# Patient Record
Sex: Female | Born: 1970 | Race: Black or African American | Hispanic: No | Marital: Single | Smoking: Never smoker
Health system: Southern US, Community
[De-identification: ages and names within clinical notes are randomized; demographics above are authoritative.]

## PROBLEM LIST (undated history)

## (undated) DIAGNOSIS — I10 Essential (primary) hypertension: Secondary | ICD-10-CM

## (undated) DIAGNOSIS — R011 Cardiac murmur, unspecified: Secondary | ICD-10-CM

## (undated) DIAGNOSIS — E7800 Pure hypercholesterolemia, unspecified: Secondary | ICD-10-CM

## (undated) HISTORY — PX: ABDOMINAL HYSTERECTOMY: SHX81

## (undated) HISTORY — DX: Essential (primary) hypertension: I10

## (undated) HISTORY — DX: Cardiac murmur, unspecified: R01.1

## (undated) HISTORY — PX: CHOLECYSTECTOMY: SHX55

---

## 2015-11-03 ENCOUNTER — Encounter: Payer: Self-pay | Admitting: Family Medicine

## 2015-11-03 ENCOUNTER — Ambulatory Visit (INDEPENDENT_AMBULATORY_CARE_PROVIDER_SITE_OTHER): Payer: BLUE CROSS/BLUE SHIELD | Admitting: Family Medicine

## 2015-11-03 VITALS — BP 157/107 | HR 86 | Resp 20 | Ht 65.5 in | Wt 217.0 lb

## 2015-11-03 DIAGNOSIS — Z90710 Acquired absence of both cervix and uterus: Secondary | ICD-10-CM

## 2015-11-03 DIAGNOSIS — Z01419 Encounter for gynecological examination (general) (routine) without abnormal findings: Secondary | ICD-10-CM

## 2015-11-03 DIAGNOSIS — I10 Essential (primary) hypertension: Secondary | ICD-10-CM

## 2015-11-03 NOTE — Assessment & Plan Note (Signed)
Needs Primary Care referral--at [facility].

## 2015-11-03 NOTE — Patient Instructions (Signed)
Hypertension Hypertension, commonly called high blood pressure, is when the force of blood pumping through your arteries is too strong. Your arteries are the blood vessels that carry blood from your heart throughout your body. A blood pressure reading consists of a higher number over a lower number, such as 110/72. The higher number (systolic) is the pressure inside your arteries when your heart pumps. The lower number (diastolic) is the pressure inside your arteries when your heart relaxes. Ideally you want your blood pressure below 120/80. Hypertension forces your heart to work harder to pump blood. Your arteries may become narrow or stiff. Having untreated or uncontrolled hypertension can cause heart attack, stroke, kidney disease, and other problems. RISK FACTORS Some risk factors for high blood pressure are controllable. Others are not.  Risk factors you cannot control include:   Race. You may be at higher risk if you are African American.  Age. Risk increases with age.  Gender. Men are at higher risk than women before age 50 years. After age 25, women are at higher risk than men. Risk factors you can control include:  Not getting enough exercise or physical activity.  Being overweight.  Getting too much fat, sugar, calories, or salt in your diet.  Drinking too much alcohol. SIGNS AND SYMPTOMS Hypertension does not usually cause signs or symptoms. Extremely high blood pressure (hypertensive crisis) may cause headache, anxiety, shortness of breath, and nosebleed. DIAGNOSIS To check if you have hypertension, your health care provider will measure your blood pressure while you are seated, with your arm held at the level of your heart. It should be measured at least twice using the same arm. Certain conditions can cause a difference in blood pressure between your right and left arms. A blood pressure reading that is higher than normal on one occasion does not mean that you need treatment. If  it is not clear whether you have high blood pressure, you may be asked to return on a different day to have your blood pressure checked again. Or, you may be asked to monitor your blood pressure at home for 1 or more weeks. TREATMENT Treating high blood pressure includes making lifestyle changes and possibly taking medicine. Living a healthy lifestyle can help lower high blood pressure. You may need to change some of your habits. Lifestyle changes may include:  Following the DASH diet. This diet is high in fruits, vegetables, and whole grains. It is low in salt, red meat, and added sugars.  Keep your sodium intake below 2,300 mg per day.  Getting at least 30-45 minutes of aerobic exercise at least 4 times per week.  Losing weight if necessary.  Not smoking.  Limiting alcoholic beverages.  Learning ways to reduce stress. Your health care provider may prescribe medicine if lifestyle changes are not enough to get your blood pressure under control, and if one of the following is true:  You are 62-65 years of age and your systolic blood pressure is above 140.  You are 6 years of age or older, and your systolic blood pressure is above 150.  Your diastolic blood pressure is above 90.  You have diabetes, and your systolic blood pressure is over 160 or your diastolic blood pressure is over 90.  You have kidney disease and your blood pressure is above 140/90.  You have heart disease and your blood pressure is above 140/90. Your personal target blood pressure may vary depending on your medical conditions, your age, and other factors. HOME CARE INSTRUCTIONS  Have your blood pressure rechecked as directed by your health care provider.   Take medicines only as directed by your health care provider. Follow the directions carefully. Blood pressure medicines must be taken as prescribed. The medicine does not work as well when you skip doses. Skipping doses also puts you at risk for  problems.  Do not smoke.   Monitor your blood pressure at home as directed by your health care provider. SEEK MEDICAL CARE IF:   You think you are having a reaction to medicines taken.  You have recurrent headaches or feel dizzy.  You have swelling in your ankles.  You have trouble with your vision. SEEK IMMEDIATE MEDICAL CARE IF:  You develop a severe headache or confusion.  You have unusual weakness, numbness, or feel faint.  You have severe chest or abdominal pain.  You vomit repeatedly.  You have trouble breathing. MAKE SURE YOU:   Understand these instructions.  Will watch your condition.  Will get help right away if you are not doing well or get worse.   This information is not intended to replace advice given to you by your health care provider. Make sure you discuss any questions you have with your health care provider.   Document Released: 10/18/2005 Document Revised: 03/04/2015 Document Reviewed: 08/10/2013 Elsevier Interactive Patient Education 2016 [place] DASH stands for "Dietary Approaches to Stop Hypertension." The DASH eating plan is a healthy eating plan that has been shown to reduce high blood pressure (hypertension). Additional health benefits may include reducing the risk of type 2 diabetes mellitus, heart disease, and stroke. The DASH eating plan may also help with weight loss. WHAT DO I NEED TO KNOW ABOUT THE DASH EATING PLAN? For the DASH eating plan, you will follow these general guidelines:  Choose foods with a percent daily value for sodium of less than 5% (as listed on the food label).  Use salt-free seasonings or herbs instead of table salt or sea salt.  Check with your health care provider or pharmacist before using salt substitutes.  Eat lower-sodium products, often labeled as "lower sodium" or "no salt added."  Eat fresh foods.  Eat more vegetables, fruits, and low-fat dairy products.  Choose whole grains.  Look for the word "whole" as the first word in the ingredient list.  Choose fish and skinless chicken or [place] more often than red meat. Limit fish, poultry, and meat to 6 oz (170 g) each day.  Limit sweets, desserts, sugars, and sugary drinks.  Choose heart-healthy fats.  Limit cheese to 1 oz (28 g) per day.  Eat more home-cooked food and less restaurant, buffet, and fast food.  Limit fried foods.  Cook foods using methods other than frying.  Limit canned vegetables. If you do use them, rinse them well to decrease the sodium.  When eating at a restaurant, ask that your food be prepared with less salt, or no salt if possible. WHAT FOODS CAN I EAT? Seek help from a dietitian for individual calorie needs. Grains Whole grain or whole wheat bread. Brown rice. Whole grain or whole wheat pasta. Quinoa, bulgur, and whole grain cereals. Low-sodium cereals. Corn or whole wheat flour tortillas. Whole grain cornbread. Whole grain crackers. Low-sodium crackers. Vegetables Fresh or frozen vegetables (raw, steamed, roasted, or grilled). Low-sodium or reduced-sodium tomato and vegetable juices. Low-sodium or reduced-sodium tomato sauce and paste. Low-sodium or reduced-sodium canned vegetables.  Fruits All fresh, canned (in natural juice), or frozen fruits. Meat and Other  Protein Products Ground beef (85% or leaner), grass-fed beef, or beef trimmed of fat. Skinless chicken or [place]. Ground chicken or [place]. Pork trimmed of fat. All fish and seafood. Eggs. Dried beans, peas, or lentils. Unsalted nuts and seeds. Unsalted canned beans. Dairy Low-fat dairy products, such as skim or 1% milk, 2% or reduced-fat cheeses, low-fat ricotta or cottage cheese, or plain low-fat yogurt. Low-sodium or reduced-sodium cheeses. Fats and Oils Tub margarines without trans fats. Light or reduced-fat mayonnaise and salad dressings (reduced sodium). Avocado. Safflower, olive, or canola oils. Natural peanut or almond  butter. Other Unsalted popcorn and pretzels. The items listed above may not be a complete list of recommended foods or beverages. Contact your dietitian for more options. WHAT FOODS ARE NOT RECOMMENDED? Grains White bread. White pasta. White rice. Refined cornbread. Bagels and croissants. Crackers that contain trans fat. Vegetables Creamed or fried vegetables. Vegetables in a cheese sauce. Regular canned vegetables. Regular canned tomato sauce and paste. Regular tomato and vegetable juices. Fruits Dried fruits. Canned fruit in light or heavy syrup. Fruit juice. Meat and Other Protein Products Fatty cuts of meat. Ribs, chicken wings, bacon, sausage, bologna, salami, chitterlings, fatback, hot dogs, bratwurst, and packaged luncheon meats. Salted nuts and seeds. Canned beans with salt. Dairy Whole or 2% milk, cream, half-and-half, and cream cheese. Whole-fat or sweetened yogurt. Full-fat cheeses or blue cheese. Nondairy creamers and whipped toppings. Processed cheese, cheese spreads, or cheese curds. Condiments Onion and garlic salt, seasoned salt, table salt, and sea salt. Canned and packaged gravies. Worcestershire sauce. Tartar sauce. Barbecue sauce. Teriyaki sauce. Soy sauce, including reduced sodium. Steak sauce. Fish sauce. Oyster sauce. Cocktail sauce. Horseradish. Ketchup and mustard. Meat flavorings and tenderizers. Bouillon cubes. Hot sauce. Tabasco sauce. Marinades. Taco seasonings. Relishes. Fats and Oils Butter, stick margarine, lard, shortening, ghee, and bacon fat. Coconut, palm kernel, or palm oils. Regular salad dressings. Other Pickles and olives. Salted popcorn and pretzels. The items listed above may not be a complete list of foods and beverages to avoid. Contact your dietitian for more information. WHERE CAN I FIND MORE INFORMATION? National Heart, Lung, and Blood Institute: travelstabloid.com   This information is not intended to replace  advice given to you by your health care provider. Make sure you discuss any questions you have with your health care provider.   Document Released: 10/07/2011 Document Revised: 11/08/2014 Document Reviewed: 08/22/2013 Elsevier Interactive Patient Education 2016 [place] for Adults, Female A healthy lifestyle and preventive care can promote health and wellness. Preventive health guidelines for women include the following key practices.  A routine yearly physical is a good way to check with your health care provider about your health and preventive screening. It is a chance to share any concerns and updates on your health and to receive a thorough exam.  Visit your dentist for a routine exam and preventive care every 6 months. Brush your teeth twice a day and floss once a day. Good oral hygiene prevents tooth decay and gum disease.  The frequency of eye exams is based on your age, health, family medical history, use of contact lenses, and other factors. Follow your health care provider's recommendations for frequency of eye exams.  Eat a healthy diet. Foods like vegetables, fruits, whole grains, low-fat dairy products, and lean protein foods contain the nutrients you need without too many calories. Decrease your intake of foods high in solid fats, added sugars, and salt. Eat the right amount of calories for you.Get information about  a proper diet from your health care provider, if necessary.  Regular physical exercise is one of the most important things you can do for your health. Most adults should get at least 150 minutes of moderate-intensity exercise (any activity that increases your heart rate and causes you to sweat) each week. In addition, most adults need muscle-strengthening exercises on 2 or more days a week.  Maintain a healthy weight. The body mass index (BMI) is a screening tool to identify possible weight problems. It provides an estimate of body fat based on  height and weight. Your health care provider can find your BMI and can help you achieve or maintain a healthy weight.For adults 20 years and older:  A BMI below 18.5 is considered underweight.  A BMI of 18.5 to 24.9 is normal.  A BMI of 25 to 29.9 is considered overweight.  A BMI of 30 and above is considered obese.  Maintain normal blood lipids and cholesterol levels by exercising and minimizing your intake of saturated fat. Eat a balanced diet with plenty of fruit and vegetables. Blood tests for lipids and cholesterol should begin at age 62 and be repeated every 5 years. If your lipid or cholesterol levels are high, you are over 50, or you are at high risk for heart disease, you may need your cholesterol levels checked more frequently.Ongoing high lipid and cholesterol levels should be treated with medicines if diet and exercise are not working.  If you smoke, find out from your health care provider how to quit. If you do not use tobacco, do not start.  Lung cancer screening is recommended for adults aged 12-80 years who are at high risk for developing lung cancer because of a history of smoking. A yearly low-dose CT scan of the lungs is recommended for people who have at least a 30-pack-year history of smoking and are a current smoker or have quit within the past 15 years. A pack year of smoking is smoking an average of 1 pack of cigarettes a day for 1 year (for example: 1 pack a day for 30 years or 2 packs a day for 15 years). Yearly screening should continue until the smoker has stopped smoking for at least 15 years. Yearly screening should be stopped for people who develop a health problem that would prevent them from having lung cancer treatment.  If you are pregnant, do not drink alcohol. If you are breastfeeding, be very cautious about drinking alcohol. If you are not pregnant and choose to drink alcohol, do not have more than 1 drink per day. One drink is considered to be 12 ounces (355  mL) of beer, 5 ounces (148 mL) of wine, or 1.5 ounces (44 mL) of liquor.  Avoid use of street drugs. Do not share needles with anyone. Ask for help if you need support or instructions about stopping the use of drugs.  High blood pressure causes heart disease and increases the risk of stroke. Your blood pressure should be checked at least every 1 to 2 years. Ongoing high blood pressure should be treated with medicines if weight loss and exercise do not work.  If you are 59-45 years old, ask your health care provider if you should take aspirin to prevent strokes.  Diabetes screening is done by taking a blood sample to check your blood glucose level after you have not eaten for a certain period of time (fasting). If you are not overweight and you do not have risk factors for diabetes,  you should be screened once every 3 years starting at age 41. If you are overweight or obese and you are 47-51 years of age, you should be screened for diabetes every year as part of your cardiovascular risk assessment.  Breast cancer screening is essential preventive care for women. You should practice "breast self-awareness." This means understanding the normal appearance and feel of your breasts and may include breast self-examination. Any changes detected, no matter how small, should be reported to a health care provider. Women in their 78s and 30s should have a clinical breast exam (CBE) by a health care provider as part of a regular health exam every 1 to 3 years. After age 42, women should have a CBE every year. Starting at age 38, women should consider having a mammogram (breast X-ray test) every year. Women who have a family history of breast cancer should talk to their health care provider about genetic screening. Women at a high risk of breast cancer should talk to their health care providers about having an MRI and a mammogram every year.  Breast cancer gene (BRCA)-related cancer risk assessment is recommended for  women who have family members with BRCA-related cancers. BRCA-related cancers include breast, ovarian, tubal, and peritoneal cancers. Having family members with these cancers may be associated with an increased risk for harmful changes (mutations) in the breast cancer genes BRCA1 and BRCA2. Results of the assessment will determine the need for genetic counseling and BRCA1 and BRCA2 testing.  Your health care provider may recommend that you be screened regularly for cancer of the pelvic organs (ovaries, uterus, and vagina). This screening involves a pelvic examination, including checking for microscopic changes to the surface of your cervix (Pap test). You may be encouraged to have this screening done every 3 years, beginning at age 30.  For women ages 70-65, health care providers may recommend pelvic exams and Pap testing every 3 years, or they may recommend the Pap and pelvic exam, combined with testing for human papilloma virus (HPV), every 5 years. Some types of HPV increase your risk of cervical cancer. Testing for HPV may also be done on women of any age with unclear Pap test results.  Other health care providers may not recommend any screening for nonpregnant women who are considered low risk for pelvic cancer and who do not have symptoms. Ask your health care provider if a screening pelvic exam is right for you.  If you have had past treatment for cervical cancer or a condition that could lead to cancer, you need Pap tests and screening for cancer for at least 20 years after your treatment. If Pap tests have been discontinued, your risk factors (such as having a new sexual partner) need to be reassessed to determine if screening should resume. Some women have medical problems that increase the chance of getting cervical cancer. In these cases, your health care provider may recommend more frequent screening and Pap tests.  Colorectal cancer can be detected and often prevented. Most routine colorectal  cancer screening begins at the age of 91 years and continues through age 74 years. However, your health care provider may recommend screening at an earlier age if you have risk factors for colon cancer. On a yearly basis, your health care provider may provide home test kits to check for hidden blood in the stool. Use of a small camera at the end of a tube, to directly examine the colon (sigmoidoscopy or colonoscopy), can detect the earliest forms of colorectal  cancer. Talk to your health care provider about this at age 35, when routine screening begins. Direct exam of the colon should be repeated every 5-10 years through age 63 years, unless early forms of precancerous polyps or small growths are found.  People who are at an increased risk for hepatitis B should be screened for this virus. You are considered at high risk for hepatitis B if:  You were born in a country where hepatitis B occurs often. Talk with your health care provider about which countries are considered high risk.  Your parents were born in a high-risk country and you have not received a shot to protect against hepatitis B (hepatitis B vaccine).  You have HIV or AIDS.  You use needles to inject street drugs.  You live with, or have sex with, someone who has hepatitis B.  You get hemodialysis treatment.  You take certain medicines for conditions like cancer, organ transplantation, and autoimmune conditions.  Hepatitis C blood testing is recommended for all people born from [birth year] through 1966 and any individual with known risks for hepatitis C.  Practice safe sex. Use condoms and avoid high-risk sexual practices to reduce the spread of sexually transmitted infections (STIs). STIs include gonorrhea, chlamydia, syphilis, trichomonas, herpes, HPV, and human immunodeficiency virus (HIV). Herpes, HIV, and HPV are viral illnesses that have no cure. They can result in disability, cancer, and death.  You should be screened for sexually  transmitted illnesses (STIs) including gonorrhea and chlamydia if:  You are sexually active and are younger than 24 years.  You are older than 24 years and your health care provider tells you that you are at risk for this type of infection.  Your sexual activity has changed since you were last screened and you are at an increased risk for chlamydia or gonorrhea. Ask your health care provider if you are at risk.  If you are at risk of being infected with HIV, it is recommended that you take a prescription medicine daily to prevent HIV infection. This is called preexposure prophylaxis (PrEP). You are considered at risk if:  You are sexually active and do not regularly use condoms or know the HIV status of your partner(s).  You take drugs by injection.  You are sexually active with a partner who has HIV.  Talk with your health care provider about whether you are at high risk of being infected with HIV. If you choose to begin PrEP, you should first be tested for HIV. You should then be tested every 3 months for as long as you are taking PrEP.  Osteoporosis is a disease in which the bones lose minerals and strength with aging. This can result in serious bone fractures or breaks. The risk of osteoporosis can be identified using a bone density scan. Women ages 35 years and over and women at risk for fractures or osteoporosis should discuss screening with their health care providers. Ask your health care provider whether you should take a calcium supplement or vitamin D to reduce the rate of osteoporosis.  Menopause can be associated with physical symptoms and risks. Hormone replacement therapy is available to decrease symptoms and risks. You should talk to your health care provider about whether hormone replacement therapy is right for you.  Use sunscreen. Apply sunscreen liberally and repeatedly throughout the day. You should seek shade when your shadow is shorter than you. Protect yourself by  wearing long sleeves, pants, a wide-brimmed hat, and sunglasses year round, whenever you are  outdoors.  Once a month, do a whole body skin exam, using a mirror to look at the skin on your back. Tell your health care provider of new moles, moles that have irregular borders, moles that are larger than a pencil eraser, or moles that have changed in shape or color.  Stay current with required vaccines (immunizations).  Influenza vaccine. All adults should be immunized every year.  Tetanus, diphtheria, and acellular pertussis (Td, Tdap) vaccine. Pregnant women should receive 1 dose of Tdap vaccine during each pregnancy. The dose should be obtained regardless of the length of time since the last dose. Immunization is preferred during the 27th-36th week of gestation. An adult who has not previously received Tdap or who does not know her vaccine status should receive 1 dose of Tdap. This initial dose should be followed by tetanus and diphtheria toxoids (Td) booster doses every 10 years. Adults with an unknown or incomplete history of completing a 3-dose immunization series with Td-containing vaccines should begin or complete a primary immunization series including a Tdap dose. Adults should receive a Td booster every 10 years.  Varicella vaccine. An adult without evidence of immunity to varicella should receive 2 doses or a second dose if she has previously received 1 dose. Pregnant females who do not have evidence of immunity should receive the first dose after pregnancy. This first dose should be obtained before leaving the health care facility. The second dose should be obtained 4-8 weeks after the first dose.  Human papillomavirus (HPV) vaccine. Females aged 13-26 years who have not received the vaccine previously should obtain the 3-dose series. The vaccine is not recommended for use in pregnant females. However, pregnancy testing is not needed before receiving a dose. If a female is found to be pregnant  after receiving a dose, no treatment is needed. In that case, the remaining doses should be delayed until after the pregnancy. Immunization is recommended for any person with an immunocompromised condition through the age of 16 years if she did not get any or all doses earlier. During the 3-dose series, the second dose should be obtained 4-8 weeks after the first dose. The third dose should be obtained 24 weeks after the first dose and 16 weeks after the second dose.  Zoster vaccine. One dose is recommended for adults aged 80 years or older unless certain conditions are present.  Measles, mumps, and rubella (MMR) vaccine. Adults born before [birth year] generally are considered immune to measles and mumps. Adults born in [birth year] or later should have 1 or more doses of MMR vaccine unless there is a contraindication to the vaccine or there is laboratory evidence of immunity to each of the three diseases. A routine second dose of MMR vaccine should be obtained at least 28 days after the first dose for students attending postsecondary schools, health care workers, or international travelers. People who received inactivated measles vaccine or an unknown type of measles vaccine during 1963-1967 should receive 2 doses of MMR vaccine. People who received inactivated mumps vaccine or an unknown type of mumps vaccine before 1979 and are at high risk for mumps infection should consider immunization with 2 doses of MMR vaccine. For females of childbearing age, rubella immunity should be determined. If there is no evidence of immunity, females who are not pregnant should be vaccinated. If there is no evidence of immunity, females who are pregnant should delay immunization until after pregnancy. Unvaccinated health care workers born before 1957 who lack laboratory evidence of measles,  mumps, or rubella immunity or laboratory confirmation of disease should consider measles and mumps immunization with 2 doses of MMR vaccine or rubella  immunization with 1 dose of MMR vaccine.  Pneumococcal 13-valent conjugate (PCV13) vaccine. When indicated, a person who is uncertain of his immunization history and has no record of immunization should receive the PCV13 vaccine. All adults 57 years of age and older should receive this vaccine. An adult aged 61 years or older who has certain medical conditions and has not been previously immunized should receive 1 dose of PCV13 vaccine. This PCV13 should be followed with a dose of pneumococcal polysaccharide (PPSV23) vaccine. Adults who are at high risk for pneumococcal disease should obtain the PPSV23 vaccine at least 8 weeks after the dose of PCV13 vaccine. Adults older than 46 years of age who have normal immune system function should obtain the PPSV23 vaccine dose at least 1 year after the dose of PCV13 vaccine.  Pneumococcal polysaccharide (PPSV23) vaccine. When PCV13 is also indicated, PCV13 should be obtained first. All adults aged 39 years and older should be immunized. An adult younger than age 27 years who has certain medical conditions should be immunized. Any person who resides in a nursing home or long-term care facility should be immunized. An adult smoker should be immunized. People with an immunocompromised condition and certain other conditions should receive both PCV13 and PPSV23 vaccines. People with human immunodeficiency virus (HIV) infection should be immunized as soon as possible after diagnosis. Immunization during chemotherapy or radiation therapy should be avoided. Routine use of PPSV23 vaccine is not recommended for American Indians, [place] Natives, or people younger than 65 years unless there are medical conditions that require PPSV23 vaccine. When indicated, people who have unknown immunization and have no record of immunization should receive PPSV23 vaccine. One-time revaccination 5 years after the first dose of PPSV23 is recommended for people aged 19-64 years who have chronic  kidney failure, nephrotic syndrome, asplenia, or immunocompromised conditions. People who received 1-2 doses of PPSV23 before age 27 years should receive another dose of PPSV23 vaccine at age 80 years or later if at least 5 years have passed since the previous dose. Doses of PPSV23 are not needed for people immunized with PPSV23 at or after age 18 years.  Meningococcal vaccine. Adults with asplenia or persistent complement component deficiencies should receive 2 doses of quadrivalent meningococcal conjugate (MenACWY-D) vaccine. The doses should be obtained at least 2 months apart. Microbiologists working with certain meningococcal bacteria, [place] recruits, people at risk during an outbreak, and people who travel to or live in countries with a high rate of meningitis should be immunized. A first-year college student up through age 54 years who is living in a residence hall should receive a dose if she did not receive a dose on or after her 16th birthday. Adults who have certain high-risk conditions should receive one or more doses of vaccine.  Hepatitis A vaccine. Adults who wish to be protected from this disease, have certain high-risk conditions, work with hepatitis A-infected animals, work in hepatitis A research labs, or travel to or work in countries with a high rate of hepatitis A should be immunized. Adults who were previously unvaccinated and who anticipate close contact with an international adoptee during the first 60 days after arrival in the [place] from a country with a high rate of hepatitis A should be immunized.  Hepatitis B vaccine. Adults who wish to be protected from this disease, have certain high-risk conditions,  may be exposed to blood or other infectious body fluids, are household contacts or sex partners of hepatitis B positive people, are clients or workers in certain care facilities, or travel to or work in countries with a high rate of hepatitis B should be  immunized.  Haemophilus influenzae type b (Hib) vaccine. A previously unvaccinated person with asplenia or sickle cell disease or having a scheduled splenectomy should receive 1 dose of Hib vaccine. Regardless of previous immunization, a recipient of a hematopoietic stem cell transplant should receive a 3-dose series 6-12 months after her successful transplant. Hib vaccine is not recommended for adults with HIV infection. Preventive Services / Frequency Ages 88 to 25 years  Blood pressure check.** / Every 3-5 years.  Lipid and cholesterol check.** / Every 5 years beginning at age 82.  Clinical breast exam.** / Every 3 years for women in their 54s and 81s.  BRCA-related cancer risk assessment.** / For women who have family members with a BRCA-related cancer (breast, ovarian, tubal, or peritoneal cancers).  Pap test.** / Every 2 years from ages 59 through 38. Every 3 years starting at age 11 through age 21 or 69 with a history of 3 consecutive normal Pap tests.  HPV screening.** / Every 3 years from ages 11 through ages 55 to 10 with a history of 3 consecutive normal Pap tests.  Hepatitis C blood test.** / For any individual with known risks for hepatitis C.  Skin self-exam. / Monthly.  Influenza vaccine. / Every year.  Tetanus, diphtheria, and acellular pertussis (Tdap, Td) vaccine.** / Consult your health care provider. Pregnant women should receive 1 dose of Tdap vaccine during each pregnancy. 1 dose of Td every 10 years.  Varicella vaccine.** / Consult your health care provider. Pregnant females who do not have evidence of immunity should receive the first dose after pregnancy.  HPV vaccine. / 3 doses over 6 months, if 33 and younger. The vaccine is not recommended for use in pregnant females. However, pregnancy testing is not needed before receiving a dose.  Measles, mumps, rubella (MMR) vaccine.** / You need at least 1 dose of MMR if you were born in 1957 or later. You may also need  a 2nd dose. For females of childbearing age, rubella immunity should be determined. If there is no evidence of immunity, females who are not pregnant should be vaccinated. If there is no evidence of immunity, females who are pregnant should delay immunization until after pregnancy.  Pneumococcal 13-valent conjugate (PCV13) vaccine.** / Consult your health care provider.  Pneumococcal polysaccharide (PPSV23) vaccine.** / 1 to 2 doses if you smoke cigarettes or if you have certain conditions.  Meningococcal vaccine.** / 1 dose if you are age 8 to 57 years and a Market researcher living in a residence hall, or have one of several medical conditions, you need to get vaccinated against meningococcal disease. You may also need additional booster doses.  Hepatitis A vaccine.** / Consult your health care provider.  Hepatitis B vaccine.** / Consult your health care provider.  Haemophilus influenzae type b (Hib) vaccine.** / Consult your health care provider. Ages 86 to 78 years  Blood pressure check.** / Every year.  Lipid and cholesterol check.** / Every 5 years beginning at age 40 years.  Lung cancer screening. / Every year if you are aged 6-80 years and have a 30-pack-year history of smoking and currently smoke or have quit within the past 15 years. Yearly screening is stopped once you have quit  smoking for at least 15 years or develop a health problem that would prevent you from having lung cancer treatment.  Clinical breast exam.** / Every year after age 78 years.  BRCA-related cancer risk assessment.** / For women who have family members with a BRCA-related cancer (breast, ovarian, tubal, or peritoneal cancers).  Mammogram.** / Every year beginning at age 79 years and continuing for as long as you are in good health. Consult with your health care provider.  Pap test.** / Every 3 years starting at age 73 years through age 52 or 53 years with a history of 3 consecutive normal Pap  tests.  HPV screening.** / Every 3 years from ages 43 years through ages 31 to 40 years with a history of 3 consecutive normal Pap tests.  Fecal occult blood test (FOBT) of stool. / Every year beginning at age 62 years and continuing until age 57 years. You may not need to do this test if you get a colonoscopy every 10 years.  Flexible sigmoidoscopy or colonoscopy.** / Every 5 years for a flexible sigmoidoscopy or every 10 years for a colonoscopy beginning at age 72 years and continuing until age 57 years.  Hepatitis C blood test.** / For all people born from [birth year] through 1965 and any individual with known risks for hepatitis C.  Skin self-exam. / Monthly.  Influenza vaccine. / Every year.  Tetanus, diphtheria, and acellular pertussis (Tdap/Td) vaccine.** / Consult your health care provider. Pregnant women should receive 1 dose of Tdap vaccine during each pregnancy. 1 dose of Td every 10 years.  Varicella vaccine.** / Consult your health care provider. Pregnant females who do not have evidence of immunity should receive the first dose after pregnancy.  Zoster vaccine.** / 1 dose for adults aged 38 years or older.  Measles, mumps, rubella (MMR) vaccine.** / You need at least 1 dose of MMR if you were born in 1957 or later. You may also need a second dose. For females of childbearing age, rubella immunity should be determined. If there is no evidence of immunity, females who are not pregnant should be vaccinated. If there is no evidence of immunity, females who are pregnant should delay immunization until after pregnancy.  Pneumococcal 13-valent conjugate (PCV13) vaccine.** / Consult your health care provider.  Pneumococcal polysaccharide (PPSV23) vaccine.** / 1 to 2 doses if you smoke cigarettes or if you have certain conditions.  Meningococcal vaccine.** / Consult your health care provider.  Hepatitis A vaccine.** / Consult your health care provider.  Hepatitis B vaccine.** / Consult  your health care provider.  Haemophilus influenzae type b (Hib) vaccine.** / Consult your health care provider. Ages 37 years and over  Blood pressure check.** / Every year.  Lipid and cholesterol check.** / Every 5 years beginning at age 36 years.  Lung cancer screening. / Every year if you are aged 2-80 years and have a 30-pack-year history of smoking and currently smoke or have quit within the past 15 years. Yearly screening is stopped once you have quit smoking for at least 15 years or develop a health problem that would prevent you from having lung cancer treatment.  Clinical breast exam.** / Every year after age 38 years.  BRCA-related cancer risk assessment.** / For women who have family members with a BRCA-related cancer (breast, ovarian, tubal, or peritoneal cancers).  Mammogram.** / Every year beginning at age 35 years and continuing for as long as you are in good health. Consult with your health care provider.  Pap test.** / Every 3 years starting at age 18 years through age 32 or 62 years with 3 consecutive normal Pap tests. Testing can be stopped between 65 and 70 years with 3 consecutive normal Pap tests and no abnormal Pap or HPV tests in the past 10 years.  HPV screening.** / Every 3 years from ages 38 years through ages 30 or 25 years with a history of 3 consecutive normal Pap tests. Testing can be stopped between 65 and 70 years with 3 consecutive normal Pap tests and no abnormal Pap or HPV tests in the past 10 years.  Fecal occult blood test (FOBT) of stool. / Every year beginning at age 75 years and continuing until age 85 years. You may not need to do this test if you get a colonoscopy every 10 years.  Flexible sigmoidoscopy or colonoscopy.** / Every 5 years for a flexible sigmoidoscopy or every 10 years for a colonoscopy beginning at age 97 years and continuing until age 58 years.  Hepatitis C blood test.** / For all people born from [birth year] through 1965 and any  individual with known risks for hepatitis C.  Osteoporosis screening.** / A one-time screening for women ages 15 years and over and women at risk for fractures or osteoporosis.  Skin self-exam. / Monthly.  Influenza vaccine. / Every year.  Tetanus, diphtheria, and acellular pertussis (Tdap/Td) vaccine.** / 1 dose of Td every 10 years.  Varicella vaccine.** / Consult your health care provider.  Zoster vaccine.** / 1 dose for adults aged 43 years or older.  Pneumococcal 13-valent conjugate (PCV13) vaccine.** / Consult your health care provider.  Pneumococcal polysaccharide (PPSV23) vaccine.** / 1 dose for all adults aged 22 years and older.  Meningococcal vaccine.** / Consult your health care provider.  Hepatitis A vaccine.** / Consult your health care provider.  Hepatitis B vaccine.** / Consult your health care provider.  Haemophilus influenzae type b (Hib) vaccine.** / Consult your health care provider. ** Family history and personal history of risk and conditions may change your health care provider's recommendations.   This information is not intended to replace advice given to you by your health care provider. Make sure you discuss any questions you have with your health care provider.   Document Released: 12/14/2001 Document Revised: 11/08/2014 Document Reviewed: 03/15/2011 Elsevier Interactive Patient Education [employer].

## 2015-11-03 NOTE — Progress Notes (Signed)
  Subjective:     Barbara Vasquez is a 45 y.o. female and is here for a comprehensive physical exam. The patient reports problems - headaches.  Social History   Social History  . Marital Status: Single    Spouse Name: N/A  . Number of Children: N/A  . Years of Education: N/A   Occupational History  . Not on file.   Social History Main Topics  . Smoking status: Never Smoker   . Smokeless tobacco: Not on file  . Alcohol Use: Yes     Comment: occasional  . Drug Use: No  . Sexual Activity: Not Currently    Birth Control/ Protection: Surgical   Other Topics Concern  . Not on file   Social History Narrative  . No narrative on file   Health Maintenance  Topic Date Due  . HIV Screening  02/23/1985  . TETANUS/TDAP  02/23/1989  . PAP SMEAR  02/24/1991  . INFLUENZA VACCINE  11/02/2016 (Originally 06/02/2015)    The following portions of the patient's history were reviewed and updated as appropriate: allergies, current medications, past family history, past medical history, past social history, past surgical history and problem list.  Review of Systems Pertinent items noted in HPI and remainder of comprehensive ROS otherwise negative.   Objective:    BP 157/107 mmHg  Pulse 86  Resp 20  Ht 5' 5.5" (1.664 m)  Wt 217 lb (98.431 kg)  BMI 35.55 kg/m2 General appearance: alert, cooperative and appears stated age Head: Normocephalic, without obvious abnormality, atraumatic Neck: no adenopathy, supple, symmetrical, trachea midline, thyroid not enlarged, symmetric, no tenderness/mass/nodules and acanthosis nigricans Lungs: clear to auscultation bilaterally Breasts: normal appearance, no masses or tenderness Heart: regular rate and rhythm, S1, S2 normal, no murmur, click, rub or gallop Abdomen: soft, non-tender; bowel sounds normal; no masses,  no organomegaly Pelvic: external genitalia normal, no adnexal masses or tenderness, uterus surgically absent and vagina normal without  discharge Extremities: extremities normal, atraumatic, no cyanosis or edema Pulses: 2+ and symmetric Skin: Skin color, texture, turgor normal. No rashes or lesions Lymph nodes: Cervical, supraclavicular, and axillary nodes normal. Neurologic: Grossly normal    Assessment:    Healthy female exam.      Plan:   Problem List Items Addressed This Visit      Unprioritized   Hypertension    [facility]       Other Visit Diagnoses    Encounter for routine gynecological examination    -  Primary    Relevant Orders    CBC    Comprehensive metabolic panel    TSH    Lipid panel    MM DIGITAL SCREENING BILATERAL       See After Visit Summary for Counseling Recommendations

## 2015-11-04 LAB — CBC
HCT: 40.7 % (ref 36.0–46.0)
HEMOGLOBIN: 13.6 g/dL (ref 12.0–15.0)
MCH: 30.8 pg (ref 26.0–34.0)
MCHC: 33.4 g/dL (ref 30.0–36.0)
MCV: 92.3 fL (ref 78.0–100.0)
MPV: 9.1 fL (ref 8.6–12.4)
PLATELETS: 353 10*3/uL (ref 150–400)
RBC: 4.41 MIL/uL (ref 3.87–5.11)
RDW: 13.4 % (ref 11.5–15.5)
WBC: 10.1 10*3/uL (ref 4.0–10.5)

## 2015-11-05 ENCOUNTER — Telehealth: Payer: Self-pay | Admitting: *Deleted

## 2015-11-05 LAB — COMPREHENSIVE METABOLIC PANEL
ALT: 17 U/L (ref 6–29)
AST: 16 U/L (ref 10–35)
Albumin: 4.2 g/dL (ref 3.6–5.1)
Alkaline Phosphatase: 88 U/L (ref 33–115)
BILIRUBIN TOTAL: 0.2 mg/dL (ref 0.2–1.2)
BUN: 12 mg/dL (ref 7–25)
CHLORIDE: 102 mmol/L (ref 98–110)
CO2: 26 mmol/L (ref 20–31)
CREATININE: 0.85 mg/dL (ref 0.50–1.10)
Calcium: 9.3 mg/dL (ref 8.6–10.2)
GLUCOSE: 96 mg/dL (ref 65–99)
Potassium: 3.9 mmol/L (ref 3.5–5.3)
SODIUM: 136 mmol/L (ref 135–146)
Total Protein: 7.3 g/dL (ref 6.1–8.1)

## 2015-11-05 LAB — LIPID PANEL
CHOL/HDL RATIO: 6.3 ratio — AB (ref <=5.0)
Cholesterol: 272 mg/dL — ABNORMAL HIGH (ref 125–200)
HDL: 43 mg/dL — ABNORMAL LOW (ref >=46)
LDL CALC: 200 mg/dL — AB (ref <130)
Triglycerides: 147 mg/dL (ref <150)
VLDL: 29 mg/dL (ref <30)

## 2015-11-05 LAB — TSH: TSH: 1.249 u[IU]/mL (ref 0.350–4.500)

## 2015-11-05 NOTE — Telephone Encounter (Signed)
Informed pt of results and high cholesterol level.  Pt has appointment made at [facility] on 11-11-14 to get established with PCP to address BP and high cholesterol.

## 2015-11-05 NOTE — Telephone Encounter (Signed)
-----   Message from Reva Boresanya S Pratt, MD sent at 11/05/2015  7:48 AM EST ----- Her cholesterol remains too high--she may broach with PCP--ensure referral due to BP--other labs look good.

## 2015-11-12 ENCOUNTER — Encounter: Payer: Self-pay | Admitting: Primary Care

## 2015-11-12 ENCOUNTER — Ambulatory Visit (INDEPENDENT_AMBULATORY_CARE_PROVIDER_SITE_OTHER): Payer: BLUE CROSS/BLUE SHIELD | Admitting: Primary Care

## 2015-11-12 ENCOUNTER — Encounter (INDEPENDENT_AMBULATORY_CARE_PROVIDER_SITE_OTHER): Payer: Self-pay

## 2015-11-12 VITALS — BP 162/110 | HR 93 | Temp 98.9°F | Ht 66.0 in | Wt 217.0 lb

## 2015-11-12 DIAGNOSIS — E785 Hyperlipidemia, unspecified: Secondary | ICD-10-CM

## 2015-11-12 DIAGNOSIS — I10 Essential (primary) hypertension: Secondary | ICD-10-CM

## 2015-11-12 DIAGNOSIS — Z114 Encounter for screening for human immunodeficiency virus [HIV]: Secondary | ICD-10-CM | POA: Diagnosis not present

## 2015-11-12 DIAGNOSIS — E78019 Familial hypercholesterolemia, unspecified: Secondary | ICD-10-CM | POA: Insufficient documentation

## 2015-11-12 MED ORDER — ATORVASTATIN CALCIUM 40 MG PO TABS: 40.0000 mg | ORAL_TABLET | Freq: Every day | ORAL | Status: DC

## 2015-11-12 MED ORDER — HYDROCHLOROTHIAZIDE 25 MG PO TABS: 25.0000 mg | ORAL_TABLET | Freq: Every day | ORAL | Status: DC

## 2015-11-12 NOTE — Progress Notes (Signed)
Pre visit review using our clinic review tool, if applicable. No additional management support is needed unless otherwise documented below in the visit note. 

## 2015-11-12 NOTE — Assessment & Plan Note (Signed)
Uncontrolled BP in clinic today, also at GYN office. Suspect this is the cause of her headaches. Start HCTZ 25 mg today.  Follow up in 2 weeks for recheck. BMP next visit.

## 2015-11-12 NOTE — Patient Instructions (Signed)
Start Hydrochlorothiazide 25 mg tablets for high blood pressure. Take 1 tablet by mouth every morning.  Start atorvastatin 40 mg tablets for high cholesterol. Take 1 tablet by mouth every evening at bedtime.  Continue to increase consumption of vegetables, lean (baked/grilled) meats.  Start exercising. You should be getting 1 hour of moderate intensity exercise 5 days weekly.  Schedule a lab only appointment in 3 months for recheck of your cholesterol.  Check your blood pressure daily, around the same time of day, for the next 2 weeks.   Ensure that you have rested for 30 minutes prior to checking your blood pressure. Record your readings and bring them to your next visit.  Follow up in 2 weeks for re-evaluation of blood pressure.  It was a pleasure to meet you today! Please don't hesitate to call me with any questions. Welcome to [facility]!  Hypertension Hypertension, commonly called high blood pressure, is when the force of blood pumping through your arteries is too strong. Your arteries are the blood vessels that carry blood from your heart throughout your body. A blood pressure reading consists of a higher number over a lower number, such as 110/72. The higher number (systolic) is the pressure inside your arteries when your heart pumps. The lower number (diastolic) is the pressure inside your arteries when your heart relaxes. Ideally you want your blood pressure below 120/80. Hypertension forces your heart to work harder to pump blood. Your arteries may become narrow or stiff. Having untreated or uncontrolled hypertension can cause heart attack, stroke, kidney disease, and other problems. RISK FACTORS Some risk factors for high blood pressure are controllable. Others are not.  Risk factors you cannot control include:   Race. You may be at higher risk if you are African American.  Age. Risk increases with age.  Gender. Men are at higher risk than women before age 52 years. After age 78,  women are at higher risk than men. Risk factors you can control include:  Not getting enough exercise or physical activity.  Being overweight.  Getting too much fat, sugar, calories, or salt in your diet.  Drinking too much alcohol. SIGNS AND SYMPTOMS Hypertension does not usually cause signs or symptoms. Extremely high blood pressure (hypertensive crisis) may cause headache, anxiety, shortness of breath, and nosebleed. DIAGNOSIS To check if you have hypertension, your health care provider will measure your blood pressure while you are seated, with your arm held at the level of your heart. It should be measured at least twice using the same arm. Certain conditions can cause a difference in blood pressure between your right and left arms. A blood pressure reading that is higher than normal on one occasion does not mean that you need treatment. If it is not clear whether you have high blood pressure, you may be asked to return on a different day to have your blood pressure checked again. Or, you may be asked to monitor your blood pressure at home for 1 or more weeks. TREATMENT Treating high blood pressure includes making lifestyle changes and possibly taking medicine. Living a healthy lifestyle can help lower high blood pressure. You may need to change some of your habits. Lifestyle changes may include:  Following the DASH diet. This diet is high in fruits, vegetables, and whole grains. It is low in salt, red meat, and added sugars.  Keep your sodium intake below 2,300 mg per day.  Getting at least 30-45 minutes of aerobic exercise at least 4 times per week.  Losing weight if necessary.  Not smoking.  Limiting alcoholic beverages.  Learning ways to reduce stress. Your health care provider may prescribe medicine if lifestyle changes are not enough to get your blood pressure under control, and if one of the following is true:  You are 9018-46 years of age and your systolic blood pressure  is above 140.  You are 46 years of age or older, and your systolic blood pressure is above 150.  Your diastolic blood pressure is above 90.  You have diabetes, and your systolic blood pressure is over 140 or your diastolic blood pressure is over 90.  You have kidney disease and your blood pressure is above 140/90.  You have heart disease and your blood pressure is above 140/90. Your personal target blood pressure may vary depending on your medical conditions, your age, and other factors. HOME CARE INSTRUCTIONS  Have your blood pressure rechecked as directed by your health care provider.   Take medicines only as directed by your health care provider. Follow the directions carefully. Blood pressure medicines must be taken as prescribed. The medicine does not work as well when you skip doses. Skipping doses also puts you at risk for problems.  Do not smoke.   Monitor your blood pressure at home as directed by your health care provider. SEEK MEDICAL CARE IF:   You think you are having a reaction to medicines taken.  You have recurrent headaches or feel dizzy.  You have swelling in your ankles.  You have trouble with your vision. SEEK IMMEDIATE MEDICAL CARE IF:  You develop a severe headache or confusion.  You have unusual weakness, numbness, or feel faint.  You have severe chest or abdominal pain.  You vomit repeatedly.  You have trouble breathing. MAKE SURE YOU:   Understand these instructions.  Will watch your condition.  Will get help right away if you are not doing well or get worse.   This information is not intended to replace advice given to you by your health care provider. Make sure you discuss any questions you have with your health care provider.   Document Released: 10/18/2005 Document Revised: 03/04/2015 Document Reviewed: 08/10/2013 Elsevier Interactive Patient Education [employer]2016 Elsevier Inc.

## 2015-11-12 NOTE — Assessment & Plan Note (Signed)
History of hyperlipidemia in past, once managed on Zocor. Lipids completed at GYN office recently with TC of 272 and LDL of 200. Spent a long time discussing the long term effects of uncontrolled hypertension and cholesterol as she was reticent to start medication. She verbalized understanding and agreed to treatment. Start atorvastatin 40 mg today. LFT's WNL from recent labs. Will repeat lipids in 3 months. Discussed the importance of a healthy diet and regular exercise in order for weight loss and to reduce risk of other medical diseases.

## 2015-11-12 NOTE — Progress Notes (Signed)
Subjective:    Patient ID: Barbara Vasquez, female    DOB: 1971-10-22, 45 y.o.   MRN: [id number]  HPI  Barbara Vasquez is a 45 year old female who presents today to establish care and discuss the problems mentioned below. Will review recent records, she has not had care since 2006 and does not remember the name of her prior provider. She also request testing for HIV as she does this annually.   1) Essential Hypertension: Elevated reading at the GYN office on 11/03/15 and elevated in clinic today. She reports headaches, located mostly to frontal and left side of cranium. She's unsure if she's had elevated blood pressure readings in the past. She's never been managed on medication for hypertension in the past. Denies prior history of frequent headaches or migraines. Denies chest pain or changes in vision.   BP Readings from Last 3 Encounters:  11/12/15 162/110  11/03/15 157/107     2) Headaches: Headaches began in September 2016. Mostly located to left temporal region and frontal region. She denise prior history of frequent headaches or migraines.  3) Hyperlipidemia: History of several years ago. Once managed on medication, Zocor. She endorses a fair diet and will watch her intake of sodium consumtion. She didn't feel as though the Zocor worked well and took herself off. She's not had management of cholesterol in years.  Her diet currently consists of: Breakfast: Skips or cereal Lunch: Belvita cookies, welches fruit snacks, potato chips occasionally. Dinner: [employer] lean meats, some fried foods, vegetables.  Desserts: Once a month. Beverages: Water, hot tea with honey, occasional lipton tea  Exercise: Sit ups against wall, squats every morning, walks once weekly at the gym with her cousin. She enjoys exercising but has difficulty finding time.   Review of Systems  Constitutional: Negative for unexpected weight change.  HENT: Negative for rhinorrhea.   Respiratory: Negative for cough  and shortness of breath.   Cardiovascular: Negative for chest pain.  Gastrointestinal: Negative for diarrhea and constipation.  Genitourinary: Negative for difficulty urinating.  Musculoskeletal: Negative for arthralgias.  Skin: Negative for rash.  Allergic/Immunologic: Negative for environmental allergies.  Neurological: Positive for headaches. Negative for numbness.  Psychiatric/Behavioral:       Denies concerns for anxiety or depression. History of anxiety in the past.       Past Medical History  Diagnosis Date  . Heart murmur     as child  . Essential hypertension     Social History   Social History  . Marital Status: Single    Spouse Name: N/A  . Number of Children: N/A  . Years of Education: N/A   Occupational History  . Not on file.   Social History Main Topics  . Smoking status: Never Smoker   . Smokeless tobacco: Not on file  . Alcohol Use: Yes     Comment: occasional  . Drug Use: No  . Sexual Activity: Not Currently    Birth Control/ Protection: Surgical   Other Topics Concern  . Not on file   Social History Narrative   [place] from [place].   2 children.  5 grandchildren.   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       Past Surgical History  Procedure Laterality Date  . Abdominal hysterectomy    . Cholecystectomy      Family History  Problem Relation Age of Onset  . Hypertension Father   . Hypertension Sister   .  Diabetes Maternal Aunt   . Cancer Paternal Grandmother     Breast    Allergies  Allergen Reactions  . Penicillins Swelling    Swelling of the throat    No current outpatient prescriptions on file prior to visit.   No current facility-administered medications on file prior to visit.    BP 162/110 mmHg  Pulse 93  Temp(Src) 98.9 F (37.2 C) (Oral)  Ht 5\' 6"  (1.676 m)  Wt 217 lb (98.431 kg)  BMI 35.04 kg/m2  SpO2 99%    Objective:   Physical Exam  Constitutional: She is oriented to  person, place, and time. She appears well-nourished.  Eyes: Conjunctivae are normal. Pupils are equal, round, and reactive to light.  Neck: Neck supple.  Cardiovascular: Normal rate and regular rhythm.   Pulmonary/Chest: Effort normal and breath sounds normal.  Neurological: She is alert and oriented to person, place, and time.  Skin: Skin is warm and dry.  Psychiatric: She has a normal mood and affect.          Assessment & Plan:

## 2015-11-13 ENCOUNTER — Encounter: Payer: Self-pay | Admitting: *Deleted

## 2015-11-13 LAB — HIV ANTIBODY (ROUTINE TESTING W REFLEX): HIV 1&2 Ab, 4th Generation: NONREACTIVE

## 2015-11-19 ENCOUNTER — Ambulatory Visit
Admission: RE | Admit: 2015-11-19 | Discharge: 2015-11-19 | Disposition: A | Discharge status: Home / Self Care | Payer: BLUE CROSS/BLUE SHIELD | Source: Ambulatory Visit | Attending: Family Medicine | Admitting: Family Medicine

## 2015-11-19 DIAGNOSIS — Z1231 Encounter for screening mammogram for malignant neoplasm of breast: Secondary | ICD-10-CM | POA: Insufficient documentation

## 2015-11-19 DIAGNOSIS — Z01419 Encounter for gynecological examination (general) (routine) without abnormal findings: Secondary | ICD-10-CM

## 2015-11-26 ENCOUNTER — Ambulatory Visit (INDEPENDENT_AMBULATORY_CARE_PROVIDER_SITE_OTHER): Payer: BLUE CROSS/BLUE SHIELD | Admitting: Primary Care

## 2015-11-26 ENCOUNTER — Encounter: Payer: Self-pay | Admitting: Primary Care

## 2015-11-26 VITALS — BP 126/84 | HR 118 | Temp 98.6°F | Ht 66.0 in | Wt 213.8 lb

## 2015-11-26 DIAGNOSIS — E785 Hyperlipidemia, unspecified: Secondary | ICD-10-CM | POA: Diagnosis not present

## 2015-11-26 DIAGNOSIS — I10 Essential (primary) hypertension: Secondary | ICD-10-CM | POA: Diagnosis not present

## 2015-11-26 LAB — BASIC METABOLIC PANEL
BUN: 17 mg/dL (ref 6–23)
CALCIUM: 9.5 mg/dL (ref 8.4–10.5)
CO2: 30 mEq/L (ref 19–32)
Chloride: 104 mEq/L (ref 96–112)
Creatinine, Ser: 1.18 mg/dL (ref 0.40–1.20)
GFR: 63.49 mL/min (ref >60.00)
Glucose, Bld: 122 mg/dL — ABNORMAL HIGH (ref 70–99)
Potassium: 3.5 mEq/L (ref 3.5–5.1)
SODIUM: 139 meq/L (ref 135–145)

## 2015-11-26 NOTE — Assessment & Plan Note (Signed)
BP improved on HCTZ 25 mg. Feeling tired since starting on medication, will obtain BMP today as electrolytes may be imbalanced. Also discussed to take this at night, but cautioned about nocturia. BMP pending. If no improvement in 2 weeks, will consider switching to lisinopril as she was already experiencing ankle edema prior to initiation of meds.

## 2015-11-26 NOTE — Assessment & Plan Note (Signed)
Managed on atorvastatin . No myalgias. Will have her schedule 3 month lipid panel.

## 2015-11-26 NOTE — Patient Instructions (Addendum)
Continue hydrochlorothiazide tablets for blood pressure. Start taking 1 tablet by mouth every evening at bedtime. Send me an update through My Chart in 2 weeks.   Continue atorvastatin 40 mg at bedtime.  Schedule a lab only appointment in 3 months to recheck cholesterol.  Complete lab work prior to leaving today. I will notify you of your results once received.   Follow up in 6 months for re-evaluation of blood pressure.  It was a pleasure to see you today!

## 2015-11-26 NOTE — Progress Notes (Signed)
Pre visit review using our clinic review tool, if applicable. No additional management support is needed unless otherwise documented below in the visit note. 

## 2015-11-26 NOTE — Progress Notes (Signed)
Subjective:    Patient ID: Barbara Vasquez, female    DOB: 10/02/1971, 45 y.o.   MRN: [id number]  HPI  Barbara Vasquez is a 45 year old female who presents today for follow up.   1) Essential Hypertension: She was evaluated as a new patient on 11/12/15 with uncontrolled BP. She had several elevated readings in the past, including at her GYN office. She was initiated on HCTZ 25 mg and is here for her 2 week follow up.  Since her last visit she's noticed a reduction in water retention and has lost 4 pounds. Her BP is now improved in the clinic today at 126/84 and at home with readings of 111-120/80's. She denies chest pain, dizziness. She has noticed feeling more tired with less energy within 1 day after starting her medication. She will come home from work feeling exhausted which is different from normal. She's taking her medication in the morning.  2) Hyperlipidemia: Presented as a new patient 2 weeks ago with TC and LDL above goal from recent visit with GYN office. She was initiated on atorvastatin 40 mg at that visit. Denies myalgias.   Review of Systems  Constitutional: Positive for fatigue.  Respiratory: Negative for shortness of breath.   Cardiovascular: Negative for chest pain and leg swelling.  Neurological: Negative for dizziness and headaches.       Past Medical History  Diagnosis Date  . Heart murmur     as child  . Essential hypertension     Social History   Social History  . Marital Status: Single    Spouse Name: N/A  . Number of Children: N/A  . Years of Education: N/A   Occupational History  . Not on file.   Social History Main Topics  . Smoking status: Never Smoker   . Smokeless tobacco: Not on file  . Alcohol Use: Yes     Comment: occasional  . Drug Use: No  . Sexual Activity: Not Currently    Birth Control/ Protection: Surgical   Other Topics Concern  . Not on file   Social History Narrative   Moved from [place].   2 children.  5  grandchildren.   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       Past Surgical History  Procedure Laterality Date  . Abdominal hysterectomy    . Cholecystectomy      Family History  Problem Relation Age of Onset  . Hypertension Father   . Hypertension Sister   . Diabetes Maternal Aunt   . Breast cancer Paternal Grandmother 31    Allergies  Allergen Reactions  . Penicillins Swelling    Swelling of the throat    Current Outpatient Prescriptions on File Prior to Visit  Medication Sig Dispense Refill  . atorvastatin (LIPITOR) 40 MG tablet Take 1 tablet (40 mg total) by mouth daily. 90 tablet 3  . hydrochlorothiazide (HYDRODIURIL) 25 MG tablet Take 1 tablet (25 mg total) by mouth daily. 30 tablet 2   No current facility-administered medications on file prior to visit.    BP 126/84 mmHg  Pulse 118  Temp(Src) 98.6 F (37 C) (Oral)  Ht  (1.676 m)  Wt 213 lb 12.8 oz (96.979 kg)  BMI 34.52 kg/m2  SpO2 97%    Objective:   Physical Exam  Constitutional: She appears well-nourished.  Cardiovascular: Regular rhythm.   Sinus tachycardia at 105  Pulmonary/Chest: Effort normal and breath sounds normal.  Skin: Skin is warm and dry.          Assessment & Plan:

## 2015-12-03 ENCOUNTER — Encounter: Payer: Self-pay | Admitting: *Deleted

## 2015-12-05 ENCOUNTER — Encounter: Payer: Self-pay | Admitting: Primary Care

## 2015-12-15 ENCOUNTER — Encounter: Payer: Self-pay | Admitting: Primary Care

## 2016-01-13 ENCOUNTER — Encounter: Payer: Self-pay | Admitting: Primary Care

## 2016-01-13 ENCOUNTER — Emergency Department: Payer: BLUE CROSS/BLUE SHIELD

## 2016-01-13 ENCOUNTER — Emergency Department
Admission: EM | Admit: 2016-01-13 | Discharge: 2016-01-13 | Disposition: A | Discharge status: Home / Self Care | Payer: BLUE CROSS/BLUE SHIELD | Attending: Emergency Medicine | Admitting: Emergency Medicine

## 2016-01-13 ENCOUNTER — Encounter: Payer: Self-pay | Admitting: Emergency Medicine

## 2016-01-13 DIAGNOSIS — K0889 Other specified disorders of teeth and supporting structures: Secondary | ICD-10-CM | POA: Diagnosis not present

## 2016-01-13 DIAGNOSIS — L03211 Cellulitis of face: Secondary | ICD-10-CM

## 2016-01-13 DIAGNOSIS — Z880 Allergy status to penicillin: Secondary | ICD-10-CM | POA: Insufficient documentation

## 2016-01-13 DIAGNOSIS — K047 Periapical abscess without sinus: Secondary | ICD-10-CM | POA: Insufficient documentation

## 2016-01-13 DIAGNOSIS — R079 Chest pain, unspecified: Secondary | ICD-10-CM | POA: Insufficient documentation

## 2016-01-13 DIAGNOSIS — Z79899 Other long term (current) drug therapy: Secondary | ICD-10-CM | POA: Insufficient documentation

## 2016-01-13 DIAGNOSIS — R51 Headache: Secondary | ICD-10-CM | POA: Diagnosis present

## 2016-01-13 DIAGNOSIS — I10 Essential (primary) hypertension: Secondary | ICD-10-CM | POA: Insufficient documentation

## 2016-01-13 DIAGNOSIS — K0381 Cracked tooth: Secondary | ICD-10-CM | POA: Diagnosis not present

## 2016-01-13 HISTORY — DX: Pure hypercholesterolemia, unspecified: E78.00

## 2016-01-13 LAB — CBC
HEMATOCRIT: 41.4 % (ref 35.0–47.0)
Hemoglobin: 14.2 g/dL (ref 12.0–16.0)
MCH: 31.2 pg (ref 26.0–34.0)
MCHC: 34.4 g/dL (ref 32.0–36.0)
MCV: 90.9 fL (ref 80.0–100.0)
PLATELETS: 341 10*3/uL (ref 150–440)
RBC: 4.56 MIL/uL (ref 3.80–5.20)
RDW: 12.6 % (ref 11.5–14.5)
WBC: 14.3 10*3/uL — ABNORMAL HIGH (ref 3.6–11.0)

## 2016-01-13 LAB — BASIC METABOLIC PANEL
Anion gap: 6 (ref 5–15)
BUN: 18 mg/dL (ref 6–20)
CHLORIDE: 101 mmol/L (ref 101–111)
CO2: 28 mmol/L (ref 22–32)
CREATININE: 1.13 mg/dL — AB (ref 0.44–1.00)
Calcium: 9.1 mg/dL (ref 8.9–10.3)
GFR calc Af Amer: >60 mL/min (ref >60)
GFR calc non Af Amer: 58 mL/min — ABNORMAL LOW (ref >60)
GLUCOSE: 114 mg/dL — AB (ref 65–99)
POTASSIUM: 3.5 mmol/L (ref 3.5–5.1)
SODIUM: 135 mmol/L (ref 135–145)

## 2016-01-13 LAB — TROPONIN I

## 2016-01-13 MED ORDER — MORPHINE SULFATE (PF) 4 MG/ML IV SOLN
4.0000 mg | Freq: Once | INTRAVENOUS | Status: AC
  Administered 2016-01-13: 4 mg via INTRAVENOUS
  Filled 2016-01-13: qty 1

## 2016-01-13 MED ORDER — CLINDAMYCIN HCL 300 MG PO CAPS: 300.0000 mg | ORAL_CAPSULE | Freq: Four times a day (QID) | ORAL | Status: DC

## 2016-01-13 MED ORDER — CLINDAMYCIN HCL 150 MG PO CAPS
300.0000 mg | ORAL_CAPSULE | Freq: Once | ORAL | Status: AC
  Administered 2016-01-13: 300 mg via ORAL
  Filled 2016-01-13: qty 2

## 2016-01-13 MED ORDER — IOHEXOL 300 MG/ML  SOLN
75.0000 mL | Freq: Once | INTRAMUSCULAR | Status: AC | PRN
  Administered 2016-01-13: 75 mL via INTRAVENOUS

## 2016-01-13 MED ORDER — ONDANSETRON HCL 4 MG/2ML IJ SOLN
4.0000 mg | Freq: Once | INTRAMUSCULAR | Status: AC
  Administered 2016-01-13: 4 mg via INTRAVENOUS
  Filled 2016-01-13: qty 2

## 2016-01-13 MED ORDER — HYDROCODONE-ACETAMINOPHEN 5-325 MG PO TABS: 1.0000 | ORAL_TABLET | Freq: Four times a day (QID) | ORAL | Status: DC | PRN

## 2016-01-13 MED ORDER — SODIUM CHLORIDE 0.9 % IV BOLUS (SEPSIS)
1000.0000 mL | Freq: Once | INTRAVENOUS | Status: AC
Start: 2016-01-13 — End: 2016-01-13
  Administered 2016-01-13: 1000 mL via INTRAVENOUS

## 2016-01-13 NOTE — ED Notes (Signed)
Says dental pain and swelling.  Today at work started having chest pain < 1 hr ago.

## 2016-01-13 NOTE — ED Provider Notes (Signed)
[facility] Emergency Department Provider Note  Time seen: 12:55 PM  I have reviewed the triage vital signs and the nursing notes.   HISTORY  Chief Complaint Dental Pain and Chest Pain    HPI Barbara Vasquez is a 45 y.o. female with a past medical history of hypertension, hyperlipidemia, presents the emergency department with left facial pain and swelling. Patient also states her 9:00 this morning she had a brief episode of chest discomfort although states it resolved shortly thereafter, denies any chest pain currently. Denies any shortness of breath, nausea or diaphoresis at any time. Patient states she has had 2 fractured teeth in the left upper molars, states they occasionally heard her but has not been hurting her for the past 2 weeks.Describes her facial pain currently is a 10/10, severe pain in the left upper teeth and left face. Patient denies fever, nausea, vomiting.     Past Medical History  Diagnosis Date  . Heart murmur     as child  . Essential hypertension   . Hypercholesteremia     Patient Active Problem List   Diagnosis Date Noted  . Hyperlipidemia 11/12/2015  . Hypertension 11/03/2015    Past Surgical History  Procedure Laterality Date  . Abdominal hysterectomy    . Cholecystectomy      Current Outpatient Rx  Name  Route  Sig  Dispense  Refill  . atorvastatin (LIPITOR) 40 MG tablet   Oral   Take 1 tablet (40 mg total) by mouth daily.   90 tablet   3   . hydrochlorothiazide (HYDRODIURIL) 25 MG tablet   Oral   Take 1 tablet (25 mg total) by mouth daily.   30 tablet   2     Allergies Penicillins  Family History  Problem Relation Age of Onset  . Hypertension Father   . Hypertension Sister   . Diabetes Maternal Aunt   . Breast cancer Paternal Grandmother 34    Social History Social History  Substance Use Topics  . Smoking status: Never Smoker   . Smokeless tobacco: None  . Alcohol Use: No     Comment:  occasional    Review of Systems Constitutional: Negative for fever ENT: Positive for left facial pain and left upper tooth pain Cardiovascular: Negative for chest pain Gastrointestinal: Negative for abdominal pain Musculoskeletal: Negative for back pain. Neurological: Negative for headache 10-point ROS otherwise negative.  ____________________________________________   PHYSICAL EXAM:  VITAL SIGNS: ED Triage Vitals  Enc Vitals Group     BP 01/13/16 1033 127/86 mmHg     Pulse Rate 01/13/16 1033 96     Resp 01/13/16 1033 14     Temp 01/13/16 1033 98.1 F (36.7 C)     Temp Source 01/13/16 1033 Oral     SpO2 01/13/16 1033 98 %     Weight 01/13/16 1033 212 lb (96.163 kg)     Height 01/13/16 1033  (1.651 m)     Head Cir --      Peak Flow --      Pain Score 01/13/16 1033 5     Pain Loc --      Pain Edu? --      Excl. in GC? --     Constitutional: Alert and oriented. Well appearing and in no distress. Eyes: Normal exam, EOMI. ENT   Head: Normocephalic and atraumatic. Left facial swelling, moderate tenderness palpation. Patient has poor dentition overall, 2 fractured teeth of the left upper molars, considerable  tenderness to palpation above these teeth although no obvious or easily accessible abscess.   Mouth/Throat: Mucous membranes are moist. Cardiovascular: Normal rate, regular rhythm. No murmur Respiratory: Normal respiratory effort without tachypnea nor retractions. Breath sounds are clear Gastrointestinal: Soft and nontender. No distention.   Musculoskeletal: Nontender with normal range of motion in all extremities. Neurologic:  Normal speech and language. No gross focal neurologic deficits  Skin:  Skin is warm, dry and intact.  Psychiatric: Mood and affect are normal. Speech and behavior are normal.   ____________________________________________    EKG  EKG reviewed and interpreted by myself shows sinus tachycardia 110 bpm, narrow QRS, normal axis,  normal intervals, no ST changes. Normal EKG, besides mild tachycardia.  ____________________________________________    RADIOLOGY  CT consistent with left facial cellulitis without discernible abscess.  Chest x-ray shows no acute findings.  ____________________________________________    INITIAL IMPRESSION / ASSESSMENT AND PLAN / ED COURSE  Pertinent labs & imaging results that were available during my care of the patient were reviewed by me and considered in my medical decision making (see chart for details).  Patient presents the emergency department left facial pain and swelling. Poor dentition especially along the left upper molars. Suspect facial abscess/cellulitis due to dental origin. We will obtain labs, CT scan of the face with contrast to further evaluate. Patient states she recently obtained dental insurance. As far as the patient's chest pain she states it was brief lasting a minute or 2 around 9:00 this morning, patient's labs including troponin are negative. EKG is reassuring. Isaiah chest pain or discomfort since.  CT consistent with left facial cellulitis, no discernible abscess noted. I discussed this with the patient, offered admission, she greatly prefers to be discharged home. We will place the patient on clindamycin as well as Norco for pain, the patient is to follow-up with her dentist as soon as possible. I discussed strict return precautions with the patient for any fever, vomiting unable to keep down her antibiotics, or any increased swelling or redness. Patient is agreeable to this plan.    ____________________________________________   FINAL CLINICAL IMPRESSION(S) / ED DIAGNOSES  Chest pain Dental abscess Facial cellulitis  Minna AntisKevin Dominyk Law, MD 01/13/16 [phone]

## 2016-01-13 NOTE — ED Notes (Signed)
Pt transferred to CT.

## 2016-01-13 NOTE — Discharge Instructions (Signed)
You have been seen in the emergency department today for a facial infection. Please take your antibiotics as prescribed. Please follow-up with a dentist as soon as possible, call today to make an appointment. Return to the emergency department for any worsening pain or swelling, any eye pain, fever, or vomiting unable to keep down her antibiotics.   OPTIONS FOR DENTAL FOLLOW UP CARE  [place] Department of Health and Human Services - Local Safety Net Dental Clinics [email or web address]   [facility] [phone])  Sharl Ma [phone])  [place] [phone])  [facility] [phone])  [facility] [phone]) This clinic caters to the indigent population and is on a lottery system. Location: [employer] of Dentistry, [employer], [address], [place] Clinic Hours: Wednesdays from 6pm - 9pm, patients seen by a lottery system. For dates, call or go to [email or web address] Services: Cleanings, fillings and simple extractions. Payment Options: DENTAL WORK IS FREE OF CHARGE. Bring proof of income or support. Best way to get seen: Arrive at 5:15 pm - this is a lottery, NOT first come/first serve, so arriving earlier will not increase your chances of being seen.     [facility] [phone] Select option 1 for emergencies   Location: [facility], [place], [address] [place] Clinic Hours: No walk-ins accepted - call the day before to schedule an appointment. Check in times are 9:30 am and 1:30 pm. Services: Simple extractions, temporary fillings, pulpectomy/pulp debridement, uncomplicated abscess drainage. Payment Options: PAYMENT IS DUE AT THE TIME OF SERVICE.  Fee is usually $100-200, additional surgical procedures (e.g. abscess drainage) may be extra. Cash, checks,  Visa/MasterCard accepted.  Can file Medicaid if patient is covered for dental - patient should call case worker to check. No discount for [facility] patients. Best way to get seen: MUST call the day before and get onto the schedule. Can usually be seen the next 1-2 days. No walk-ins accepted.     [facility] [phone]   Location: [facility], [address] [place] Clinic Hours: M, W, Th, F 8am or 1:30pm, Tues 9a or 1:30 - first come/first served. Services: Simple extractions, temporary fillings, uncomplicated abscess drainage.  You do not need to be an [facility] resident. Payment Options: PAYMENT IS DUE AT THE TIME OF SERVICE. Dental insurance, otherwise sliding scale - bring proof of income or support. Depending on income and treatment needed, cost is usually $50-200. Best way to get seen: Arrive early as it is first come/first served.     [facility] [phone]   Location: [address] Clinic Hours: Mon-Thu 8a-5p Services: Most basic dental services including extractions and fillings. Payment Options: PAYMENT IS DUE AT THE TIME OF SERVICE. Sliding scale, up to 50% off - bring proof if income or support. Medicaid with dental option accepted. Best way to get seen: Call to schedule an appointment, can usually be seen within 2 weeks OR they will try to see walk-ins - show up at 8a or 2p (you may have to wait).     [facility] [phone] [place] RESIDENTS ONLY   Location: [facility], [address], [place], [place] [address] Clinic Hours: By appointment only. Monday - Thursday 8am-5pm, Friday 8am-12pm Services: Cleanings, fillings, extractions. Payment Options: PAYMENT IS DUE AT THE TIME OF SERVICE. Cash, Visa or MasterCard. Sliding scale - $30 minimum  per service. Best way to get seen: Come in to office, complete packet and  make an appointment - need proof of income or support monies for each household member and proof of [facility] [place] [facility] [place] residence. Usually takes about a month to get in.     [facility] [phone]   Location: [address] [place] [facility] Hours: Walk-in Urgent Care Dental Services are offered Monday-Friday mornings only. The numbers of emergencies accepted daily is limited to the number of providers available. Maximum 15 - Mondays, Wednesdays & Thursdays Maximum 10 - Tuesdays & Fridays Services: You do not need to be a [place] resident to be seen for a dental emergency. Emergencies are defined as pain, swelling, abnormal bleeding, or dental trauma. Walkins will receive x-rays if needed. NOTE: Dental cleaning is not an emergency. Payment Options: PAYMENT IS DUE AT THE TIME OF SERVICE. Minimum co-pay is $40.00 for uninsured patients. Minimum co-pay is $3.00 for Medicaid with dental coverage. Dental Insurance is accepted and must be presented at time of visit. Medicare does not cover dental. Forms of payment: Cash, credit card, checks. Best way to get seen: If not previously registered with the clinic, walk-in dental registration begins at 7:15 am and is on a first come/first serve basis. If previously registered with the clinic, call to make an appointment.     [facility] [phone] [place] RESIDENTS ONLY   Location: [address], [place], [place] Clinic Hours: Mon-Thu 10a-2p Services: Extractions only! Payment Options: FREE (donations accepted) - bring proof of income or support Best way to get seen: Call and schedule an appointment OR come at 8am on the 1st Monday of every month (except for holidays) when it is first come/first served.     [facility] [phone]   Location: [address], [place] Clinic Hours: Friday mornings Services, Payment Options, Best way to get seen: Call for  info    Cellulitis Cellulitis is an infection of the skin and the tissue beneath it. The infected area is usually red and tender. Cellulitis occurs most often in the arms and lower legs.  CAUSES  Cellulitis is caused by bacteria that enter the skin through cracks or cuts in the skin. The most common types of bacteria that cause cellulitis are staphylococci and streptococci. SIGNS AND SYMPTOMS   Redness and warmth.  Swelling.  Tenderness or pain.  Fever. DIAGNOSIS  Your health care provider can usually determine what is wrong based on a physical exam. Blood tests may also be done. TREATMENT  Treatment usually involves taking an antibiotic medicine. HOME CARE INSTRUCTIONS   Take your antibiotic medicine as directed by your health care provider. Finish the antibiotic even if you start to feel better.  Keep the infected arm or leg elevated to reduce swelling.  Apply a warm cloth to the affected area up to 4 times per day to relieve pain.  Take medicines only as directed by your health care provider.  Keep all follow-up visits as directed by your health care provider. SEEK MEDICAL CARE IF:   You notice red streaks coming from the infected area.  Your red area gets larger or turns dark in color.  Your bone or joint underneath the infected area becomes painful after the skin has healed.  Your infection returns in the same area or another area.  You notice a swollen bump in the infected area.  You develop new symptoms.  You have a fever. SEEK IMMEDIATE MEDICAL  CARE IF:   You feel very sleepy.  You develop vomiting or diarrhea.  You have a general ill feeling (malaise) with muscle aches and pains.   This information is not intended to replace advice given to you by your health care provider. Make sure you discuss any questions you have with your health care provider.   Document Released: 07/28/2005 Document Revised: 07/09/2015 Document Reviewed: 01/03/2012 Elsevier  Interactive Patient Education [employer]

## 2016-01-15 ENCOUNTER — Ambulatory Visit (INDEPENDENT_AMBULATORY_CARE_PROVIDER_SITE_OTHER): Payer: BLUE CROSS/BLUE SHIELD | Admitting: Primary Care

## 2016-01-15 ENCOUNTER — Encounter: Payer: Self-pay | Admitting: Primary Care

## 2016-01-15 VITALS — Ht 66.0 in | Wt 213.4 lb

## 2016-01-15 DIAGNOSIS — Z09 Encounter for follow-up examination after completed treatment for conditions other than malignant neoplasm: Secondary | ICD-10-CM

## 2016-01-15 NOTE — Progress Notes (Signed)
Subjective:    Patient ID: Barbara Vasquez, female    DOB: [birth year], 45 y.o.   MRN: [id number]  HPI  Barbara Vasquez is a 45 year old female who presents today for hospital follow up. She presented to [facility] ED on 01/13/16 with complaints of left sided facial swelling and pain. Her swelling began at 9 am that morning. She has 2 fractured teeth to the left upper molars that have been present for several months.   During her stay in the ED she underwent labs and CT scan of the face. Her WBC count was elevated and CT scan was consistent for facial cellulitis. She was provided with a prescription for clindamycin for her infection and Norco PRN pain.  She has an appointment with her dentist on Monday. She's been taking her antibiotics as prescribed. The norco has helped slightly.   2) Insomnia: Present intermittently for years. She doesn't have difficulty falling asleep, but will wake up after 3-4 hours of sleep and be unable to fall back asleep. She does snore and has noticed waking up once monthly choking. She has not tried OTC sleep aids as she does like taking medications. She has never been screened for sleep apnea.   Review of Systems  Constitutional: Negative for fever and fatigue.  Respiratory: Negative for shortness of breath.   Cardiovascular: Negative for chest pain.  Skin: Negative for color change.       Mild swelling to left face  Psychiatric/Behavioral: Positive for sleep disturbance.       Past Medical History  Diagnosis Date  . Heart murmur     as child  . Essential hypertension   . Hypercholesteremia     Social History   Social History  . Marital Status: Single    Spouse Name: N/A  . Number of Children: N/A  . Years of Education: N/A   Occupational History  . Not on file.   Social History Main Topics  . Smoking status: Never Smoker   . Smokeless tobacco: Not on file  . Alcohol Use: No     Comment: occasional  . Drug Use: No  . Sexual Activity: Not  Currently    Birth Control/ Protection: Surgical   Other Topics Concern  . Not on file   Social History Narrative   Moved from [place].   2 children.  5 grandchildren.   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       Past Surgical History  Procedure Laterality Date  . Abdominal hysterectomy    . Cholecystectomy      Family History  Problem Relation Age of Onset  . Hypertension Father   . Hypertension Sister   . Diabetes Maternal Aunt   . Breast cancer Paternal Grandmother 56    Allergies  Allergen Reactions  . Penicillins Swelling    Swelling of the throat    Current Outpatient Prescriptions on File Prior to Visit  Medication Sig Dispense Refill  . atorvastatin (LIPITOR) 40 MG tablet Take 1 tablet (40 mg total) by mouth daily. 90 tablet 3  . clindamycin (CLEOCIN) 300 MG capsule Take 1 capsule (300 mg total) by mouth 4 (four) times daily. 40 capsule 0  . hydrochlorothiazide (HYDRODIURIL) 25 MG tablet Take 1 tablet (25 mg total) by mouth daily. 30 tablet 2  . HYDROcodone-acetaminophen (NORCO/VICODIN) 5-325 MG tablet Take 1-2 tablets by mouth every 6 (six) hours as needed for moderate pain. 20 tablet 0  No current facility-administered medications on file prior to visit.    Ht 5\' 6"  (1.676 m)  Wt 213 lb 6.4 oz (96.798 kg)  BMI 34.46 kg/m2    Objective:   Physical Exam  Constitutional: She appears well-nourished.  HENT:  No obvious abscess to left upper molars.  Neck: Neck supple.  Cardiovascular: Normal rate and regular rhythm.   Pulmonary/Chest: Effort normal and breath sounds normal.  Skin: Skin is warm and dry. No erythema.  Mild swelling to left face.          Assessment & Plan:  [facility] ED Follow Up:  [facility]16 for facial swelling. Underwent lab testing and CT which was supportive for facial cellullitis. Has multiple broken teeth to left upper molars. Dentist appointment Monday. She has been  compliant to her antibiotics.  Overall facial swelling drastically reduced based off of pictures she has of initial swelling. Continue current regimen. Follow up with Dentist as scheduled.  All labs, imaging, and notes reviewed from ED visit.

## 2016-01-15 NOTE — Progress Notes (Signed)
Pre visit review using our clinic review tool, if applicable. No additional management support is needed unless otherwise documented below in the visit note. 

## 2016-01-15 NOTE — Patient Instructions (Addendum)
Continue Clindamycin antibiotics as prescribed.   Please notify me when you'd like to go for a sleep study to evaluate for sleep apnea.  Follow up with the dentist as scheduled.   It was a pleasure to see you today!

## 2016-02-25 ENCOUNTER — Ambulatory Visit (INDEPENDENT_AMBULATORY_CARE_PROVIDER_SITE_OTHER): Payer: BLUE CROSS/BLUE SHIELD | Admitting: Primary Care

## 2016-02-25 ENCOUNTER — Encounter: Payer: Self-pay | Admitting: Primary Care

## 2016-02-25 ENCOUNTER — Other Ambulatory Visit: Payer: Self-pay | Admitting: Primary Care

## 2016-02-25 ENCOUNTER — Ambulatory Visit: Payer: BLUE CROSS/BLUE SHIELD | Admitting: Primary Care

## 2016-02-25 VITALS — BP 112/82 | HR 89 | Temp 98.0°F | Ht 65.0 in | Wt 227.8 lb

## 2016-02-25 DIAGNOSIS — E785 Hyperlipidemia, unspecified: Secondary | ICD-10-CM

## 2016-02-25 DIAGNOSIS — I10 Essential (primary) hypertension: Secondary | ICD-10-CM | POA: Diagnosis not present

## 2016-02-25 LAB — LIPID PANEL
CHOLESTEROL: 175 mg/dL (ref 0–200)
HDL: 35.1 mg/dL — AB (ref >39.00)
LDL CALC: 104 mg/dL — AB (ref 0–99)
NonHDL: 140.21
TRIGLYCERIDES: 183 mg/dL — AB (ref 0.0–149.0)
Total CHOL/HDL Ratio: 5
VLDL: 36.6 mg/dL (ref 0.0–40.0)

## 2016-02-25 LAB — HEMOGLOBIN A1C: Hgb A1c MFr Bld: 6.2 % (ref 4.6–6.5)

## 2016-02-25 MED ORDER — HYDROCHLOROTHIAZIDE 25 MG PO TABS: 25.0000 mg | ORAL_TABLET | Freq: Every day | ORAL | Status: DC

## 2016-02-25 NOTE — Assessment & Plan Note (Signed)
Stable on HCTZ, refills provided.

## 2016-02-25 NOTE — Progress Notes (Signed)
Pre visit review using our clinic review tool, if applicable. No additional management support is needed unless otherwise documented below in the visit note. 

## 2016-02-25 NOTE — Patient Instructions (Signed)
Complete lab work prior to leaving today. I will notify you of your results once received.   I've sent a refill of your hydrochlorothiazide for your blood pressure. Your blood pressure looks great.   Continue your efforts towards a healthy lifestyle.   It was a pleasure to see you today!

## 2016-02-25 NOTE — Assessment & Plan Note (Signed)
Managed on atorvastatin 40 mg.  Lipid panel pending today. Denies myalgias. Discussed the importance of a healthy diet and regular exercise in order for weight loss and to reduce risk of other medical diseases.

## 2016-02-25 NOTE — Progress Notes (Signed)
Subjective:    Patient ID: Barbara Vasquez, female    DOB: 1971-09-22, 45 y.o.   MRN: [id number]  HPI  [name] is a 45 year old female who presents today for follow up of chronic conditions.  1) Hyperlipidemia: Currently managed on Atorvastatin 40 mg every night. Last lipid panel in January with TC of 272, LDL of 200. She is due for recheck of her lipids today.  She endorses a fair diet: Breakfast: Fruit or orange juice Lunch: Skips Snack: Sandwich Dinner: [employer] lean meats, vegetables Desserts: Occasionally Beverages: Orange juice, water, lipton tea  Exercise: She walks daily for 1 hour daily.  2) Essential Hypertension: Currently managed on HCTZ 25 mg daily. Her BP is stable today in the clinic. Denies chest pain, dizziness, shortness of breath. She's noticed improvement in her ankle edema since on HCTZ.   BP Readings from Last 3 Encounters:  02/25/16 112/82  01/13/16 107/66  11/26/15 126/84     Review of Systems  Respiratory: Negative for shortness of breath.   Cardiovascular: Negative for chest pain.  Neurological: Negative for dizziness and headaches.       Past Medical History  Diagnosis Date  . Heart murmur     as child  . Essential hypertension   . Hypercholesteremia      Social History   Social History  . Marital Status: Single    Spouse Name: N/A  . Number of Children: N/A  . Years of Education: N/A   Occupational History  . Not on file.   Social History Main Topics  . Smoking status: Never Smoker   . Smokeless tobacco: Not on file  . Alcohol Use: No     Comment: occasional  . Drug Use: No  . Sexual Activity: Not Currently    Birth Control/ Protection: Surgical   Other Topics Concern  . Not on file   Social History Narrative   [place]   2 children.  5 grandchildren.   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       Past Surgical History  Procedure Laterality Date  . Abdominal  hysterectomy    . Cholecystectomy      Family History  Problem Relation Age of Onset  . Hypertension Father   . Hypertension Sister   . Diabetes Maternal Aunt   . Breast cancer Paternal Grandmother [id number]    Allergies  Allergen Reactions  . Penicillins Swelling    Swelling of the throat    Current Outpatient Prescriptions on File Prior to Visit  Medication Sig Dispense Refill  . atorvastatin (LIPITOR) 40 MG tablet Take 1 tablet (40 mg total) by mouth daily. 90 tablet 3  . clindamycin (CLEOCIN) 300 MG capsule Take 1 capsule (300 mg total) by mouth 4 (four) times daily. 40 capsule 0  . HYDROcodone-acetaminophen (NORCO/VICODIN) 5-325 MG tablet Take 1-2 tablets by mouth every 6 (six) hours as needed for moderate pain. 20 tablet 0   No current facility-administered medications on file prior to visit.    BP 112/82 mmHg  Pulse 89  Temp(Src) 98 F (36.7 C) (Oral)  Ht 5\' 5"  (1.651 m)  Wt 227 lb 12.8 oz (103.329 kg)  BMI 37.91 kg/m2  SpO2 97%    Objective:   Physical Exam  Constitutional: She appears well-nourished.  Cardiovascular: Normal rate and regular rhythm.   Pulmonary/Chest: Effort normal and breath sounds normal.  Skin: Skin is warm and dry.  Assessment & Plan:

## 2016-05-28 ENCOUNTER — Encounter: Payer: Self-pay | Admitting: Primary Care

## 2016-06-02 ENCOUNTER — Ambulatory Visit (INDEPENDENT_AMBULATORY_CARE_PROVIDER_SITE_OTHER): Payer: BLUE CROSS/BLUE SHIELD

## 2016-06-02 ENCOUNTER — Other Ambulatory Visit (INDEPENDENT_AMBULATORY_CARE_PROVIDER_SITE_OTHER): Payer: BLUE CROSS/BLUE SHIELD

## 2016-06-02 DIAGNOSIS — Z111 Encounter for screening for respiratory tuberculosis: Secondary | ICD-10-CM | POA: Diagnosis not present

## 2016-06-02 DIAGNOSIS — R7303 Prediabetes: Secondary | ICD-10-CM | POA: Diagnosis not present

## 2016-06-02 LAB — HEMOGLOBIN A1C: Hgb A1c MFr Bld: 6.1 % (ref 4.6–6.5)

## 2016-06-04 ENCOUNTER — Encounter: Payer: Self-pay | Admitting: *Deleted

## 2016-06-04 LAB — TB SKIN TEST
Induration: 0 mm
TB Skin Test: NEGATIVE

## 2016-07-02 ENCOUNTER — Encounter: Payer: Self-pay | Admitting: Family Medicine

## 2016-07-02 ENCOUNTER — Ambulatory Visit (INDEPENDENT_AMBULATORY_CARE_PROVIDER_SITE_OTHER): Payer: BLUE CROSS/BLUE SHIELD | Admitting: Family Medicine

## 2016-07-02 ENCOUNTER — Encounter: Payer: Self-pay | Admitting: Primary Care

## 2016-07-02 VITALS — BP 138/92 | HR 84 | Temp 98.2°F | Wt 233.2 lb

## 2016-07-02 DIAGNOSIS — M5442 Lumbago with sciatica, left side: Secondary | ICD-10-CM | POA: Diagnosis not present

## 2016-07-02 MED ORDER — CYCLOBENZAPRINE HCL 10 MG PO TABS: 10.0000 mg | ORAL_TABLET | Freq: Every evening | ORAL | 0 refills | Status: DC | PRN

## 2016-07-02 MED ORDER — MELOXICAM 15 MG PO TABS: 15.0000 mg | ORAL_TABLET | Freq: Every day | ORAL | 0 refills | Status: DC | PRN

## 2016-07-02 NOTE — Patient Instructions (Signed)
Low Back Sprain With Rehab A sprain is an injury in which a ligament is torn. The ligaments of the lower back are vulnerable to sprains. However, they are strong and require great force to be injured. These ligaments are important for stabilizing the spinal column. Sprains are classified into three categories. Grade 1 sprains cause pain, but the tendon is not lengthened. Grade 2 sprains include a lengthened ligament, due to the ligament being stretched or partially ruptured. With grade 2 sprains there is still function, although the function may be decreased. Grade 3 sprains involve a complete tear of the tendon or muscle, and function is usually impaired. SYMPTOMS  Severe pain in the lower back. Sometimes, a feeling of a "pop," "snap," or tear, at the time of injury. Tenderness and sometimes swelling at the injury site. Uncommonly, bruising (contusion) within 48 hours of injury. Muscle spasms in the back. CAUSES  Low back sprains occur when a force is placed on the ligaments that is greater than they can handle. Common causes of injury include: Performing a stressful act while off-balance. Repetitive stressful activities that involve movement of the lower back. Direct hit (trauma) to the lower back. RISK INCREASES WITH: Contact sports (football, wrestling). Collisions (major skiing accidents). Sports that require throwing or lifting (baseball, weightlifting). Sports involving twisting of the spine (gymnastics, diving, tennis, golf). Poor strength and flexibility. Inadequate protection. Previous back injury or surgery (especially fusion). PREVENTION Wear properly fitted and padded protective equipment. Warm up and stretch properly before activity. Allow for adequate recovery between workouts. Maintain physical fitness: Strength, flexibility, and endurance. Cardiovascular fitness. Maintain a healthy body weight. PROGNOSIS  If treated properly, low back sprains usually heal with  non-surgical treatment. The length of time for healing depends on the severity of the injury.  RELATED COMPLICATIONS  Recurring symptoms, resulting in a chronic problem. Chronic inflammation and pain in the low back. Delayed healing or resolution of symptoms, especially if activity is resumed too soon. Prolonged impairment. Unstable or arthritic joints of the low back. TREATMENT  Treatment first involves the use of ice and medicine, to reduce pain and inflammation. The use of strengthening and stretching exercises may help reduce pain with activity. These exercises may be performed at home or with a therapist. Severe injuries may require referral to a therapist for further evaluation and treatment, such as ultrasound. Your caregiver may advise that you wear a back brace or corset, to help reduce pain and discomfort. Often, prolonged bed rest results in greater harm then benefit. Corticosteroid injections may be recommended. However, these should be reserved for the most serious cases. It is important to avoid using your back when lifting objects. At night, sleep on your back on a firm mattress, with a pillow placed under your knees. If non-surgical treatment is unsuccessful, surgery may be needed.  MEDICATION  If pain medicine is needed, nonsteroidal anti-inflammatory medicines (aspirin and ibuprofen), or other minor pain relievers (acetaminophen), are often advised. Do not take pain medicine for 7 days before surgery. Prescription pain relievers may be given, if your caregiver thinks they are needed. Use only as directed and only as much as you need. Ointments applied to the skin may be helpful. Corticosteroid injections may be given by your caregiver. These injections should be reserved for the most serious cases, because they may only be given a certain number of times. HEAT AND COLD Cold treatment (icing) should be applied for 10 to 15 minutes every 2 to 3 hours for inflammation and  pain, and  immediately after activity that aggravates your symptoms. Use ice packs or an ice massage. Heat treatment may be used before performing stretching and strengthening activities prescribed by your caregiver, physical therapist, or athletic trainer. Use a heat pack or a warm water soak. SEEK MEDICAL CARE IF:  Symptoms get worse or do not improve in 2 to 4 weeks, despite treatment. You develop numbness or weakness in either leg. You lose bowel or bladder function. Any of the following occur after surgery: fever, increased pain, swelling, redness, drainage of fluids, or bleeding in the affected area. New, unexplained symptoms develop. (Drugs used in treatment may produce side effects.) EXERCISES  RANGE OF MOTION (ROM) AND STRETCHING EXERCISES - Low Back Sprain Most people with lower back pain will find that their symptoms get worse with excessive bending forward (flexion) or arching at the lower back (extension). The exercises that will help resolve your symptoms will focus on the opposite motion.  Your physician, physical therapist or athletic trainer will help you determine which exercises will be most helpful to resolve your lower back pain. Do not complete any exercises without first consulting with your caregiver. Discontinue any exercises which make your symptoms worse, until you speak to your caregiver. If you have pain, numbness or tingling which travels down into your buttocks, leg or foot, the goal of the therapy is for these symptoms to move closer to your back and eventually resolve. Sometimes, these leg symptoms will get better, but your lower back pain may worsen. This is often an indication of progress in your rehabilitation. Be very alert to any changes in your symptoms and the activities in which you participated in the 24 hours prior to the change. Sharing this information with your caregiver will allow him or her to most efficiently treat your condition. These exercises may help you when  beginning to rehabilitate your injury. Your symptoms may resolve with or without further involvement from your physician, physical therapist or athletic trainer. While completing these exercises, remember:  Restoring tissue flexibility helps normal motion to return to the joints. This allows healthier, less painful movement and activity. An effective stretch should be held for at least 30 seconds. A stretch should never be painful. You should only feel a gentle lengthening or release in the stretched tissue. FLEXION RANGE OF MOTION AND STRETCHING EXERCISES: STRETCH - Flexion, Single Knee to Chest  Lie on a firm bed or floor with both legs extended in front of you. Keeping one leg in contact with the floor, bring your opposite knee to your chest. Hold your leg in place by either grabbing behind your thigh or at your knee. Pull until you feel a gentle stretch in your low back. Hold __________ seconds. Slowly release your grasp and repeat the exercise with the opposite side. Repeat __________ times. Complete this exercise __________ times per day.  STRETCH - Flexion, Double Knee to Chest Lie on a firm bed or floor with both legs extended in front of you. Keeping one leg in contact with the floor, bring your opposite knee to your chest. Tense your stomach muscles to support your back and then lift your other knee to your chest. Hold your legs in place by either grabbing behind your thighs or at your knees. Pull both knees toward your chest until you feel a gentle stretch in your low back. Hold __________ seconds. Tense your stomach muscles and slowly return one leg at a time to the floor. Repeat __________ times. Complete  this exercise __________ times per day.  STRETCH - Low Trunk Rotation Lie on a firm bed or floor. Keeping your legs in front of you, bend your knees so they are both pointed toward the ceiling and your feet are flat on the floor. Extend your arms out to the side. This will stabilize  your upper body by keeping your shoulders in contact with the floor. Gently and slowly drop both knees together to one side until you feel a gentle stretch in your low back. Hold for __________ seconds. Tense your stomach muscles to support your lower back as you bring your knees back to the starting position. Repeat the exercise to the other side. Repeat __________ times. Complete this exercise __________ times per day  EXTENSION RANGE OF MOTION AND FLEXIBILITY EXERCISES: STRETCH - Extension, Prone on Elbows  Lie on your stomach on the floor, a bed will be too soft. Place your palms about shoulder width apart and at the height of your head. Place your elbows under your shoulders. If this is too painful, stack pillows under your chest. Allow your body to relax so that your hips drop lower and make contact more completely with the floor. Hold this position for __________ seconds. Slowly return to lying flat on the floor. Repeat __________ times. Complete this exercise __________ times per day.  RANGE OF MOTION - Extension, Prone Press Ups Lie on your stomach on the floor, a bed will be too soft. Place your palms about shoulder width apart and at the height of your head. Keeping your back as relaxed as possible, slowly straighten your elbows while keeping your hips on the floor. You may adjust the placement of your hands to maximize your comfort. As you gain motion, your hands will come more underneath your shoulders. Hold this position __________ seconds. Slowly return to lying flat on the floor. Repeat __________ times. Complete this exercise __________ times per day.  RANGE OF MOTION- Quadruped, Neutral Spine  Assume a hands and knees position on a firm surface. Keep your hands under your shoulders and your knees under your hips. You may place padding under your knees for comfort. Drop your head and point your tailbone toward the ground below you. This will round out your lower back like an  angry cat. Hold this position for __________ seconds. Slowly lift your head and release your tail bone so that your back sags into a large arch, like an old horse. Hold this position for __________ seconds. Repeat this until you feel limber in your low back. Now, find your "sweet spot." This will be the most comfortable position somewhere between the two previous positions. This is your neutral spine. Once you have found this position, tense your stomach muscles to support your low back. Hold this position for __________ seconds. Repeat __________ times. Complete this exercise __________ times per day.  STRENGTHENING EXERCISES - Low Back Sprain These exercises may help you when beginning to rehabilitate your injury. These exercises should be done near your "sweet spot." This is the neutral, low-back arch, somewhere between fully rounded and fully arched, that is your least painful position. When performed in this safe range of motion, these exercises can be used for people who have either a flexion or extension based injury. These exercises may resolve your symptoms with or without further involvement from your physician, physical therapist or athletic trainer. While completing these exercises, remember:  Muscles can gain both the endurance and the strength needed for everyday activities through  controlled exercises. Complete these exercises as instructed by your physician, physical therapist or athletic trainer. Increase the resistance and repetitions only as guided. You may experience muscle soreness or fatigue, but the pain or discomfort you are trying to eliminate should never worsen during these exercises. If this pain does worsen, stop and make certain you are following the directions exactly. If the pain is still present after adjustments, discontinue the exercise until you can discuss the trouble with your caregiver. STRENGTHENING - Deep Abdominals, Pelvic Tilt  Lie on a firm bed or floor.  Keeping your legs in front of you, bend your knees so they are both pointed toward the ceiling and your feet are flat on the floor. Tense your lower abdominal muscles to press your low back into the floor. This motion will rotate your pelvis so that your tail bone is scooping upwards rather than pointing at your feet or into the floor. With a gentle tension and even breathing, hold this position for __________ seconds. Repeat __________ times. Complete this exercise __________ times per day.  STRENGTHENING - Abdominals, Crunches  Lie on a firm bed or floor. Keeping your legs in front of you, bend your knees so they are both pointed toward the ceiling and your feet are flat on the floor. Cross your arms over your chest. Slightly tip your chin down without bending your neck. Tense your abdominals and slowly lift your trunk high enough to just clear your shoulder blades. Lifting higher can put excessive stress on the lower back and does not further strengthen your abdominal muscles. Control your return to the starting position. Repeat __________ times. Complete this exercise __________ times per day.  STRENGTHENING - Quadruped, Opposite UE/LE Lift  Assume a hands and knees position on a firm surface. Keep your hands under your shoulders and your knees under your hips. You may place padding under your knees for comfort. Find your neutral spine and gently tense your abdominal muscles so that you can maintain this position. Your shoulders and hips should form a rectangle that is parallel with the floor and is not twisted. Keeping your trunk steady, lift your right hand no higher than your shoulder and then your left leg no higher than your hip. Make sure you are not holding your breath. Hold this position for __________ seconds. Continuing to keep your abdominal muscles tense and your back steady, slowly return to your starting position. Repeat with the opposite arm and leg. Repeat __________ times.  Complete this exercise __________ times per day.  STRENGTHENING - Abdominals and Quadriceps, Straight Leg Raise  Lie on a firm bed or floor with both legs extended in front of you. Keeping one leg in contact with the floor, bend the other knee so that your foot can rest flat on the floor. Find your neutral spine, and tense your abdominal muscles to maintain your spinal position throughout the exercise. Slowly lift your straight leg off the floor about 6 inches for a count of 15, making sure to not hold your breath. Still keeping your neutral spine, slowly lower your leg all the way to the floor. Repeat this exercise with each leg __________ times. Complete this exercise __________ times per day. POSTURE AND BODY MECHANICS CONSIDERATIONS - Low Back Sprain Keeping correct posture when sitting, standing or completing your activities will reduce the stress put on different body tissues, allowing injured tissues a chance to heal and limiting painful experiences. The following are general guidelines for improved posture. Your physician or  physical therapist will provide you with any instructions specific to your needs. While reading these guidelines, remember: The exercises prescribed by your provider will help you have the flexibility and strength to maintain correct postures. The correct posture provides the best environment for your joints to work. All of your joints have less wear and tear when properly supported by a spine with good posture. This means you will experience a healthier, less painful body. Correct posture must be practiced with all of your activities, especially prolonged sitting and standing. Correct posture is as important when doing repetitive low-stress activities (typing) as it is when doing a single heavy-load activity (lifting). RESTING POSITIONS Consider which positions are most painful for you when choosing a resting position. If you have pain with flexion-based activities  (sitting, bending, stooping, squatting), choose a position that allows you to rest in a less flexed posture. You would want to avoid curling into a fetal position on your side. If your pain worsens with extension-based activities (prolonged standing, working overhead), avoid resting in an extended position such as sleeping on your stomach. Most people will find more comfort when they rest with their spine in a more neutral position, neither too rounded nor too arched. Lying on a non-sagging bed on your side with a pillow between your knees, or on your back with a pillow under your knees will often provide some relief. Keep in mind, being in any one position for a prolonged period of time, no matter how correct your posture, can still lead to stiffness. PROPER SITTING POSTURE In order to minimize stress and discomfort on your spine, you must sit with correct posture. Sitting with good posture should be effortless for a healthy body. Returning to good posture is a gradual process. Many people can work toward this most comfortably by using various supports until they have the flexibility and strength to maintain this posture on their own. When sitting with proper posture, your ears will fall over your shoulders and your shoulders will fall over your hips. You should use the back of the chair to support your upper back. Your lower back will be in a neutral position, just slightly arched. You may place a small pillow or folded towel at the base of your lower back for  support.  When working at a desk, create an environment that supports good, upright posture. Without extra support, muscles tire, which leads to excessive strain on joints and other tissues. Keep these recommendations in mind: CHAIR: A chair should be able to slide under your desk when your back makes contact with the back of the chair. This allows you to work closely. The chair's height should allow your eyes to be level with the upper part of your  monitor and your hands to be slightly lower than your elbows. BODY POSITION Your feet should make contact with the floor. If this is not possible, use a foot rest. Keep your ears over your shoulders. This will reduce stress on your neck and low back. INCORRECT SITTING POSTURES  If you are feeling tired and unable to assume a healthy sitting posture, do not slouch or slump. This puts excessive strain on your back tissues, causing more damage and pain. Healthier options include: Using more support, like a lumbar pillow. Switching tasks to something that requires you to be upright or walking. Talking a brief walk. Lying down to rest in a neutral-spine position. PROLONGED STANDING WHILE SLIGHTLY LEANING FORWARD  When completing a task that requires you  to lean forward while standing in one place for a long time, place either foot up on a stationary 2-4 inch high object to help maintain the best posture. When both feet are on the ground, the lower back tends to lose its slight inward curve. If this curve flattens (or becomes too large), then the back and your other joints will experience too much stress, tire more quickly, and can cause pain. CORRECT STANDING POSTURES Proper standing posture should be assumed with all daily activities, even if they only take a few moments, like when brushing your teeth. As in sitting, your ears should fall over your shoulders and your shoulders should fall over your hips. You should keep a slight tension in your abdominal muscles to brace your spine. Your tailbone should point down to the ground, not behind your body, resulting in an over-extended swayback posture.  INCORRECT STANDING POSTURES  Common incorrect standing postures include a forward head, locked knees and/or an excessive swayback. WALKING Walk with an upright posture. Your ears, shoulders and hips should all line-up. PROLONGED ACTIVITY IN A FLEXED POSITION When completing a task that requires you to bend  forward at your waist or lean over a low surface, try to find a way to stabilize 3 out of 4 of your limbs. You can place a hand or elbow on your thigh or rest a knee on the surface you are reaching across. This will provide you more stability, so that your muscles do not tire as quickly. By keeping your knees relaxed, or slightly bent, you will also reduce stress across your lower back. CORRECT LIFTING TECHNIQUES DO : Assume a wide stance. This will provide you more stability and the opportunity to get as close as possible to the object which you are lifting. Tense your abdominals to brace your spine. Bend at the knees and hips. Keeping your back locked in a neutral-spine position, lift using your leg muscles. Lift with your legs, keeping your back straight. Test the weight of unknown objects before attempting to lift them. Try to keep your elbows locked down at your sides in order get the best strength from your shoulders when carrying an object. Always ask for help when lifting heavy or awkward objects. INCORRECT LIFTING TECHNIQUES DO NOT:  Lock your knees when lifting, even if it is a small object. Bend and twist. Pivot at your feet or move your feet when needing to change directions. Assume that you can safely pick up even a paperclip without proper posture.   This information is not intended to replace advice given to you by your health care provider. Make sure you discuss any questions you have with your health care provider.   Document Released: 10/18/2005 Document Revised: 11/08/2014 Document Reviewed: 01/30/2009 [employer] Interactive Patient Education 2016 [employer] Lumbosacral Strain Lumbosacral strain is a strain of any of the parts that make up your lumbosacral vertebrae. Your lumbosacral vertebrae are the bones that make up the lower third of your backbone. Your lumbosacral vertebrae are held together by muscles and tough, fibrous tissue (ligaments).  CAUSES  A sudden blow to  your back can cause lumbosacral strain. Also, anything that causes an excessive stretch of the muscles in the low back can cause this strain. This is typically seen when people exert themselves strenuously, fall, lift heavy objects, bend, or crouch repeatedly. RISK FACTORS  Physically demanding work.  Participation in pushing or pulling sports or sports that require a sudden twist of the back (tennis, golf,  baseball).  Weight lifting.  Excessive lower back curvature.  Forward-tilted pelvis.  Weak back or abdominal muscles or both.  Tight hamstrings. SIGNS AND SYMPTOMS  Lumbosacral strain may cause pain in the area of your injury or pain that moves (radiates) down your leg.  DIAGNOSIS Your health care provider can often diagnose lumbosacral strain through a physical exam. In some cases, you may need tests such as X-ray exams.  TREATMENT  Treatment for your lower back injury depends on many factors that your clinician will have to evaluate. However, most treatment will include the use of anti-inflammatory medicines. HOME CARE INSTRUCTIONS   Avoid hard physical activities (tennis, racquetball, waterskiing) if you are not in proper physical condition for it. This may aggravate or create problems.  If you have a back problem, avoid sports requiring sudden body movements. Swimming and walking are generally safer activities.  Maintain good posture.  Maintain a healthy weight.  For acute conditions, you may put ice on the injured area.  Put ice in a plastic bag.  Place a towel between your skin and the bag.  Leave the ice on for 20 minutes, 2-3 times a day.  When the low back starts healing, stretching and strengthening exercises may be recommended. SEEK MEDICAL CARE IF:  Your back pain is getting worse.  You experience severe back pain not relieved with medicines. SEEK IMMEDIATE MEDICAL CARE IF:   You have numbness, tingling, weakness, or problems with the use of your arms or  legs.  There is a change in bowel or bladder control.  You have increasing pain in any area of the body, including your belly (abdomen).  You notice shortness of breath, dizziness, or feel faint.  You feel sick to your stomach (nauseous), are throwing up (vomiting), or become sweaty.  You notice discoloration of your toes or legs, or your feet get very cold. MAKE SURE YOU:   Understand these instructions.  Will watch your condition.  Will get help right away if you are not doing well or get worse.   This information is not intended to replace advice given to you by your health care provider. Make sure you discuss any questions you have with your health care provider.   Document Released: 07/28/2005 Document Revised: 11/08/2014 Document Reviewed: 06/06/2013 [employer] Interactive Patient Education [employer] [employer]

## 2016-07-02 NOTE — Progress Notes (Signed)
Pre visit review using our clinic review tool, if applicable. No additional management support is needed unless otherwise documented below in the visit note. 

## 2016-07-02 NOTE — Progress Notes (Signed)
Subjective:    Patient ID: Barbara Vasquez, female    DOB: Apr 18, 1971, 45 y.o.   MRN: [id number]  HPI This is a 45 yo female who presents today with back pain x 5 days. Worse this morning. 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 No heavy lifting. Day job involves sitting all day. Pain radiating down left leg this morning. Some sleep disruption,not every night. No neck pain, no abdominal pain, no bowel or bladder changes. No numbness or weakness.  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 She sees this as temporary. She is hoping to eventually get one full time, higher paying job. Has been watching her diet and avoids sodas, juice, fatty foods. Has trouble with many foods "going straight through me."    Past Medical History:  Diagnosis Date  . Essential hypertension   . Heart murmur    as child  . Hypercholesteremia    Past Surgical History:  Procedure Laterality Date  . ABDOMINAL HYSTERECTOMY    . CHOLECYSTECTOMY     Family History  Problem Relation Age of Onset  . Hypertension Father   . Hypertension Sister   . Diabetes Maternal Aunt   . Breast cancer Paternal Grandmother [id number]   Social History  Substance Use Topics  . Smoking status: Never Smoker  . Smokeless tobacco: Not on file  . Alcohol use No     Comment: occasional      Review of Systems Per HPI    Objective:   Physical Exam  Constitutional: She is oriented to person, place, and time. She appears well-developed and well-nourished. No distress.  Obese.   HENT:  Head: Normocephalic and atraumatic.  Eyes: Conjunctivae are normal. Pupils are equal, round, and reactive to light. Right eye exhibits no discharge. Left eye exhibits no discharge.  Neck: Normal range of motion. Neck supple.  Cardiovascular: Normal rate.   Pulmonary/Chest: Effort normal.  Musculoskeletal: Normal range of motion.       Cervical back:  Normal.       Thoracic back: Normal.       Lumbar back: She exhibits tenderness. She exhibits normal range of motion, no bony tenderness, no swelling and no deformity.  Lymphadenopathy:    She has no cervical adenopathy.  Neurological: She is alert and oriented to person, place, and time. She has normal reflexes. No cranial nerve deficit.  Skin: Skin is warm and dry. She is not diaphoretic.  Psychiatric: She has a normal mood and affect. Her behavior is normal. Judgment and thought content normal.  Vitals reviewed.     BP (!) 138/92   Pulse 84   Temp 98.2 F (36.8 C) (Oral)   Wt 233 lb 4 oz (105.8 kg)   SpO2 98%   BMI 38.81 kg/m  Wt Readings from Last 3 Encounters:  07/02/16 233 lb 4 oz (105.8 kg)  02/25/16 227 lb 12.8 oz (103.3 kg)  01/15/16 213 lb 6.4 oz (96.8 kg)       Assessment & Plan:  1. Left-sided low back pain with left-sided sciatica - Provided written and verbal information regarding diagnosis and treatment. - will do limited course of meloxicam- 5-7 days then PRN - provided written instructions for exercises, encouraged continued healthy food choices and avoid any heavy lifting, pushing or pulling - meloxicam (MOBIC) 15 MG tablet; Take 1 tablet (15 mg total) by mouth daily as needed for  pain.  Dispense: 20 tablet; Refill: 0 - cyclobenzaprine (FLEXERIL) 10 MG tablet; Take 1 tablet (10 mg total) by mouth at bedtime as needed for muscle spasms.  Dispense: 30 tablet; Refill: 0 - RTC precautions reviewed  Olean Ree, FNP-BC  [facility], [facility]  07/02/2016 4:30 PM

## 2016-07-21 ENCOUNTER — Encounter: Payer: Self-pay | Admitting: Primary Care

## 2016-07-21 ENCOUNTER — Telehealth: Payer: Self-pay | Admitting: Primary Care

## 2016-07-21 NOTE — Telephone Encounter (Signed)
Spoke with debbie G and she stated pt needs follow up appointment to discuss.  [facility]

## 2016-07-21 NOTE — Telephone Encounter (Signed)
Pt dropped fmla paperwork for back spasms Pt only saw debbie g for this.   Jae DireKate wanted me to check with debbie to see if this was medically necessary

## 2016-07-22 NOTE — Telephone Encounter (Signed)
Left message asking pt to call office Please schedule 30 min appointment with Barbara Vasquez to discuss fmla

## 2016-07-22 NOTE — Telephone Encounter (Signed)
Appointment 9/27 [place]work [place]

## 2016-07-22 NOTE — Telephone Encounter (Signed)
Noted  

## 2016-07-28 ENCOUNTER — Ambulatory Visit (INDEPENDENT_AMBULATORY_CARE_PROVIDER_SITE_OTHER): Payer: BLUE CROSS/BLUE SHIELD | Admitting: Primary Care

## 2016-07-28 ENCOUNTER — Ambulatory Visit (INDEPENDENT_AMBULATORY_CARE_PROVIDER_SITE_OTHER)
Admission: RE | Admit: 2016-07-28 | Discharge: 2016-07-28 | Disposition: A | Discharge status: Home / Self Care | Payer: BLUE CROSS/BLUE SHIELD | Source: Ambulatory Visit | Attending: Primary Care | Admitting: Primary Care

## 2016-07-28 ENCOUNTER — Other Ambulatory Visit: Payer: Self-pay | Admitting: Primary Care

## 2016-07-28 VITALS — BP 140/92 | HR 85 | Temp 98.3°F | Ht 65.0 in | Wt 230.8 lb

## 2016-07-28 DIAGNOSIS — M5442 Lumbago with sciatica, left side: Secondary | ICD-10-CM | POA: Insufficient documentation

## 2016-07-28 DIAGNOSIS — Z791 Long term (current) use of non-steroidal anti-inflammatories (NSAID): Secondary | ICD-10-CM

## 2016-07-28 DIAGNOSIS — J069 Acute upper respiratory infection, unspecified: Secondary | ICD-10-CM | POA: Diagnosis not present

## 2016-07-28 DIAGNOSIS — M545 Low back pain: Secondary | ICD-10-CM | POA: Diagnosis not present

## 2016-07-28 HISTORY — DX: Lumbago with sciatica, left side: M54.42

## 2016-07-28 LAB — BASIC METABOLIC PANEL
BUN: 20 mg/dL (ref 6–23)
CALCIUM: 9.2 mg/dL (ref 8.4–10.5)
CHLORIDE: 103 meq/L (ref 96–112)
CO2: 30 mEq/L (ref 19–32)
CREATININE: 1.03 mg/dL (ref 0.40–1.20)
GFR: 74.05 mL/min (ref >60.00)
Glucose, Bld: 101 mg/dL — ABNORMAL HIGH (ref 70–99)
Potassium: 3.9 mEq/L (ref 3.5–5.1)
Sodium: 139 mEq/L (ref 135–145)

## 2016-07-28 MED ORDER — CYCLOBENZAPRINE HCL 5 MG PO TABS: ORAL_TABLET | ORAL | 1 refills | Status: DC

## 2016-07-28 MED ORDER — BENZONATATE 200 MG PO CAPS: 200.0000 mg | ORAL_CAPSULE | Freq: Three times a day (TID) | ORAL | 0 refills | Status: DC | PRN

## 2016-07-28 NOTE — Assessment & Plan Note (Signed)
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 Overall feeling worse. No recent or prior workup, will start with plain films of lumbar spine and likely physical therapy depending on results. Continue ibuprofen, reduce dose of Flexeril sent to pharmacy to hopefully help with drowsiness. Continue heating pad and exercises at home. We'll complete FMLA paperwork per patient request as she calls out for back pain to 3 times monthly.

## 2016-07-28 NOTE — Patient Instructions (Addendum)
I sent a refill of Flexeril to your pharmacy. I sent 5mg  tablets rather than 10 mg. Take 1 tablet by mouth at bedtime as needed for muscle spasms.  You may take Ibuprofen 600-800 mg three times daily as needed for back pain.  Complete xray(s) prior to leaving today. I will notify you of your results once received.  Complete lab work prior to leaving today. I will notify you of your results once received.   Your symptoms are representative of a viral illness which will resolve on its own over time. Our goal is to treat your symptoms in order to aid your body in the healing process and to make you more comfortable.   You may take Benzonatate capsules for cough. Take 1 capsule by mouth three times daily as needed for cough.  I will be in touch later today or tomorrow! It was a pleasure to see you today!   Back Pain, Adult Back pain is very common in adults.The cause of back pain is rarely dangerous and the pain often gets better over time.The cause of your back pain may not be known. Some common causes of back pain include:  Strain of the muscles or ligaments supporting the spine.  Wear and tear (degeneration) of the spinal disks.  Arthritis.  Direct injury to the back. For many people, back pain may return. Since back pain is rarely dangerous, most people can learn to manage this condition on their own. HOME CARE INSTRUCTIONS Watch your back pain for any changes. The following actions may help to lessen any discomfort you are feeling:  Remain active. It is stressful on your back to sit or stand in one place for long periods of time. Do not sit, drive, or stand in one place for more than 30 minutes at a time. Take short walks on even surfaces as soon as you are able.Try to increase the length of time you walk each day.  Exercise regularly as directed by your health care provider. Exercise helps your back heal faster. It also helps avoid future injury by keeping your muscles strong and  flexible.  Do not stay in bed.Resting more than 1-2 days can delay your recovery.  Pay attention to your body when you bend and lift. The most comfortable positions are those that put less stress on your recovering back. Always use proper lifting techniques, including:  Bending your knees.  Keeping the load close to your body.  Avoiding twisting.  Find a comfortable position to sleep. Use a firm mattress and lie on your side with your knees slightly bent. If you lie on your back, put a pillow under your knees.  Avoid feeling anxious or stressed.Stress increases muscle tension and can worsen back pain.It is important to recognize when you are anxious or stressed and learn ways to manage it, such as with exercise.  Take medicines only as directed by your health care provider. Over-the-counter medicines to reduce pain and inflammation are often the most helpful.Your health care provider may prescribe muscle relaxant drugs.These medicines help dull your pain so you can more quickly return to your normal activities and healthy exercise.  Apply ice to the injured area:  Put ice in a plastic bag.  Place a towel between your skin and the bag.  Leave the ice on for 20 minutes, 2-3 times a day for the first 2-3 days. After that, ice and heat may be alternated to reduce pain and spasms.  Maintain a healthy weight. Excess weight  puts extra stress on your back and makes it difficult to maintain good posture. SEEK MEDICAL CARE IF:  You have pain that is not relieved with rest or medicine.  You have increasing pain going down into the legs or buttocks.  You have pain that does not improve in one week.  You have night pain.  You lose weight.  You have a fever or chills. SEEK IMMEDIATE MEDICAL CARE IF:   You develop new bowel or bladder control problems.  You have unusual weakness or numbness in your arms or legs.  You develop nausea or vomiting.  You develop abdominal  pain.  You feel faint.   This information is not intended to replace advice given to you by your health care provider. Make sure you discuss any questions you have with your health care provider.   Document Released: 10/18/2005 Document Revised: 11/08/2014 Document Reviewed: 02/19/2014 Elsevier Interactive Patient Education [employer]2016 Elsevier Inc.

## 2016-07-28 NOTE — Progress Notes (Signed)
Pre visit review using our clinic review tool, if applicable. No additional management support is needed unless otherwise documented below in the visit note. 

## 2016-07-28 NOTE — Progress Notes (Signed)
Subjective:    Patient ID: Barbara Vasquez, female    DOB: 1971/06/23, 45 y.o.   MRN: [id number]  HPI  Barbara Vasquez is a 45 year old female who presents today with multiple complaints.  1) Back Pain: Evaluated on 09/01 with a chief complaint of back pain for the previous 5 days. Her pain was located to the left lower side with left sided sciatica. She was provided with a prescription for Mobic and Flexeril and advised to avoid heavy lifting and to lose weight.   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  Her back pain is chronic and has been present for 1 year since starting her sedentary IT job. Her pain is located to the left lower back. She does experience intermittent numbness/tinlging with pain to her left lower extremity. She's tried wearing a brace and doing home exercises without improvement. The Flexeril causes her to feel drowsy the next day so she does not use this regularly.She will take ibuprofen on a day to day basis for her pain with some improvement. She's never had imaging for her back nor has completed physical therapy.   She calls out of work 2-3 times monthly for her back. She was told at work that [facility] and to have her provider complete paperwork.   2) Sore throat: She also reports cough, hoarseness to her voice. Her symptoms have been present for the past 4 days. Her cough is non productive. Denies fevers, chills, sick contacts. 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 She's taken OTC cough medication, Mucinex, cough drops without improvement.   Review of Systems  HENT: Positive for congestion, sore throat and voice change. Negative for sinus pressure.   Respiratory: Positive for cough. Negative for shortness of breath.   Cardiovascular: Negative for chest  pain.  Musculoskeletal: Positive for back pain.  Neurological: Positive for numbness.       Past Medical History:  Diagnosis Date  . Essential hypertension   . Heart murmur    as child  . Hypercholesteremia      Social History   Social History  . Marital status: Single    Spouse name: N/A  . Number of children: N/A  . Years of education: N/A   Occupational History  . Not on file.   Social History Main Topics  . Smoking status: Never Smoker  . Smokeless tobacco: Not on file  . Alcohol use No     Comment: occasional  . Drug use: No  . Sexual activity: Not Currently    Birth control/ protection: Surgical   Other Topics Concern  . Not on file   Social History Narrative   [place]   2 children.  5 grandchildren.   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       Past Surgical History:  Procedure Laterality Date  . ABDOMINAL HYSTERECTOMY    . CHOLECYSTECTOMY      Family History  Problem Relation Age of Onset  . Hypertension Father   . Hypertension Sister   . Diabetes Maternal Aunt   . Breast cancer Paternal Grandmother [phone]    Allergies  Allergen Reactions  . Penicillins Swelling    Swelling of the  throat    Current Outpatient Prescriptions on File Prior to Visit  Medication Sig Dispense Refill  . atorvastatin (LIPITOR) 40 MG tablet Take 1 tablet (40 mg total) by mouth daily. 90 tablet 3  . hydrochlorothiazide (HYDRODIURIL) 25 MG tablet Take 1 tablet (25 mg total) by mouth daily. 90 tablet 2  . meloxicam (MOBIC) 15 MG tablet Take 1 tablet (15 mg total) by mouth daily as needed for pain. 20 tablet 0   No current facility-administered medications on file prior to visit.     BP (!) 140/92   Pulse 85   Temp 98.3 F (36.8 C) (Oral)   Ht 5\' 5"  (1.651 m)   Wt 230 lb 12.8 oz (104.7 kg)   SpO2 98%   BMI 38.41 kg/m    Objective:   Physical Exam  Constitutional: She appears well-nourished.  HENT:  Right Ear:  Tympanic membrane and ear canal normal.  Left Ear: Tympanic membrane and ear canal normal.  Nose: Right sinus exhibits no maxillary sinus tenderness and no frontal sinus tenderness. Left sinus exhibits no maxillary sinus tenderness and no frontal sinus tenderness.  Mouth/Throat: Oropharynx is clear and moist.  Eyes: Conjunctivae are normal.  Neck: Neck supple.  Cardiovascular: Normal rate and regular rhythm.   Pulmonary/Chest: Effort normal and breath sounds normal. She has no wheezes. She has no rales.  Musculoskeletal:       Lumbar back: She exhibits decreased range of motion, tenderness and pain. She exhibits no bony tenderness.  Left lower back tender upon palpation. Negative straight leg raise bilaterally.  Lymphadenopathy:    She has no cervical adenopathy.  Skin: Skin is warm and dry.          Assessment & Plan:  Viral URI:  Cough, congestion, sore throat, voice hoarseness 4 days. No fevers. Some improvement with OTC treatment, temporary. Exam today without evidence of bacterial involvement. Vital signs stable. Lungs clear and does not appear acutely ill. Suspect viral involvement and will treat with conservative measures. Prescription for Jerilynn Som sent to pharmacy for cough. Continue Mucinex. Fluids, rest, follow-up as needed.  Morrie Sheldon, NP

## 2016-07-29 ENCOUNTER — Encounter: Payer: Self-pay | Admitting: Primary Care

## 2016-07-29 NOTE — Telephone Encounter (Signed)
FMLA paper work completed and placed on [employer]'s desk. Patient advised by Sherlean FootShaunita Donnell to apply and was told she was a candidate.

## 2016-07-29 NOTE — Telephone Encounter (Signed)
fmla paperwork In [facility] IN BOX For review and signature

## 2016-07-30 ENCOUNTER — Other Ambulatory Visit: Payer: Self-pay | Admitting: Primary Care

## 2016-07-30 DIAGNOSIS — I878 Other specified disorders of veins: Secondary | ICD-10-CM

## 2016-07-30 NOTE — Telephone Encounter (Signed)
Paperwork faxed °Pt aware °Copy for pt °Copy for file °Copy for scan °

## 2016-08-02 ENCOUNTER — Other Ambulatory Visit: Payer: Self-pay | Admitting: Primary Care

## 2016-08-02 DIAGNOSIS — I878 Other specified disorders of veins: Secondary | ICD-10-CM

## 2016-08-06 ENCOUNTER — Ambulatory Visit
Admission: RE | Admit: 2016-08-06 | Discharge: 2016-08-06 | Disposition: A | Discharge status: Home / Self Care | Payer: BLUE CROSS/BLUE SHIELD | Source: Ambulatory Visit | Attending: Primary Care | Admitting: Primary Care

## 2016-08-06 DIAGNOSIS — I878 Other specified disorders of veins: Secondary | ICD-10-CM | POA: Diagnosis not present

## 2016-08-06 DIAGNOSIS — R109 Unspecified abdominal pain: Secondary | ICD-10-CM | POA: Diagnosis not present

## 2016-08-09 DIAGNOSIS — M545 Low back pain: Secondary | ICD-10-CM | POA: Diagnosis not present

## 2016-08-17 DIAGNOSIS — M545 Low back pain: Secondary | ICD-10-CM | POA: Diagnosis not present

## 2016-08-23 ENCOUNTER — Encounter: Payer: Self-pay | Admitting: Primary Care

## 2016-08-23 ENCOUNTER — Other Ambulatory Visit: Payer: Self-pay | Admitting: Primary Care

## 2016-08-23 DIAGNOSIS — G8929 Other chronic pain: Secondary | ICD-10-CM

## 2016-08-23 DIAGNOSIS — M5442 Lumbago with sciatica, left side: Principal | ICD-10-CM

## 2016-09-01 DIAGNOSIS — M5136 Other intervertebral disc degeneration, lumbar region: Secondary | ICD-10-CM | POA: Insufficient documentation

## 2016-09-01 DIAGNOSIS — M4726 Other spondylosis with radiculopathy, lumbar region: Secondary | ICD-10-CM | POA: Diagnosis not present

## 2016-09-16 DIAGNOSIS — M545 Low back pain: Secondary | ICD-10-CM | POA: Diagnosis not present

## 2016-10-06 DIAGNOSIS — M4726 Other spondylosis with radiculopathy, lumbar region: Secondary | ICD-10-CM | POA: Diagnosis not present

## 2016-10-06 DIAGNOSIS — M5136 Other intervertebral disc degeneration, lumbar region: Secondary | ICD-10-CM | POA: Diagnosis not present

## 2016-10-14 ENCOUNTER — Other Ambulatory Visit: Payer: Self-pay | Admitting: Specialist

## 2016-10-14 DIAGNOSIS — M4726 Other spondylosis with radiculopathy, lumbar region: Secondary | ICD-10-CM

## 2016-10-20 ENCOUNTER — Ambulatory Visit
Admission: RE | Admit: 2016-10-20 | Discharge: 2016-10-20 | Disposition: A | Discharge status: Home / Self Care | Payer: BLUE CROSS/BLUE SHIELD | Source: Ambulatory Visit | Attending: Specialist | Admitting: Specialist

## 2016-10-20 DIAGNOSIS — M5136 Other intervertebral disc degeneration, lumbar region: Secondary | ICD-10-CM | POA: Insufficient documentation

## 2016-10-20 DIAGNOSIS — M545 Low back pain: Secondary | ICD-10-CM | POA: Diagnosis not present

## 2016-10-20 DIAGNOSIS — M4726 Other spondylosis with radiculopathy, lumbar region: Secondary | ICD-10-CM | POA: Diagnosis not present

## 2016-10-20 DIAGNOSIS — N83202 Unspecified ovarian cyst, left side: Secondary | ICD-10-CM | POA: Diagnosis not present

## 2016-10-22 DIAGNOSIS — M4726 Other spondylosis with radiculopathy, lumbar region: Secondary | ICD-10-CM | POA: Diagnosis not present

## 2016-10-22 DIAGNOSIS — M5136 Other intervertebral disc degeneration, lumbar region: Secondary | ICD-10-CM | POA: Diagnosis not present

## 2016-11-02 ENCOUNTER — Ambulatory Visit: Payer: BLUE CROSS/BLUE SHIELD | Admitting: Family Medicine

## 2016-11-11 ENCOUNTER — Encounter: Payer: Self-pay | Admitting: Obstetrics & Gynecology

## 2016-11-11 ENCOUNTER — Ambulatory Visit (INDEPENDENT_AMBULATORY_CARE_PROVIDER_SITE_OTHER): Payer: BLUE CROSS/BLUE SHIELD | Admitting: Obstetrics & Gynecology

## 2016-11-11 VITALS — BP 128/87 | HR 111 | Resp 18 | Ht 65.0 in | Wt 234.0 lb

## 2016-11-11 DIAGNOSIS — Z01419 Encounter for gynecological examination (general) (routine) without abnormal findings: Secondary | ICD-10-CM | POA: Diagnosis not present

## 2016-11-11 DIAGNOSIS — Z0000 Encounter for general adult medical examination without abnormal findings: Secondary | ICD-10-CM

## 2016-11-12 NOTE — Progress Notes (Signed)
GYNECOLOGY ANNUAL PREVENTATIVE CARE ENCOUNTER NOTE  Subjective:   Barbara Vasquez is a 46 y.o. [phone] female here for a routine annual gynecologic exam.  Current complaints: none.   Denies abnormal vaginal bleeding, discharge, pelvic pain, problems with intercourse or other gynecologic concerns.    Gynecologic History No LMP recorded. Patient has had a hysterectomy. Last mammogram: 11/19/2015. Results were: normal  Obstetric History OB History  Gravida Para Term Preterm AB Living  4 2 2   2 2   SAB TAB Ectopic Multiple Live Births  1 1     2     # Outcome Date GA Lbr Len/2nd Weight Sex Delivery Anes PTL Lv  4 Term 06/28/92 9431w0d  7 lb 7 oz (3.374 kg) M    LIV  3 TAB 1992        ND  2 SAB 1992 4670w0d       ND  1 Term 10/05/88 5331w0d  5 lb 5 oz (2.41 kg) F Vag-Spont   LIV      Past Medical History:  Diagnosis Date  . Essential hypertension   . Heart murmur    as child  . Hypercholesteremia     Past Surgical History:  Procedure Laterality Date  . ABDOMINAL HYSTERECTOMY    . CHOLECYSTECTOMY      Current Outpatient Prescriptions on File Prior to Visit  Medication Sig Dispense Refill  . atorvastatin (LIPITOR) 40 MG tablet Take 1 tablet (40 mg total) by mouth daily. 90 tablet 3  . cyclobenzaprine (FLEXERIL) 5 MG tablet Take 1 tablet by mouth at bedtime as needed for muscle spasms. 30 tablet 1  . hydrochlorothiazide (HYDRODIURIL) 25 MG tablet Take 1 tablet (25 mg total) by mouth daily. 90 tablet 2  . meloxicam (MOBIC) 15 MG tablet Take 1 tablet (15 mg total) by mouth daily as needed for pain. (Patient not taking: Reported on 11/11/2016) 20 tablet 0   No current facility-administered medications on file prior to visit.     Allergies  Allergen Reactions  . Penicillins Swelling    Swelling of the throat    Social History   Social History  . Marital status: Single    Spouse name: N/A  . Number of children: N/A  . Years of education: N/A   Occupational History  .  Not on file.   Social History Main Topics  . Smoking status: Never Smoker  . Smokeless tobacco: Never Used  . Alcohol use Yes     Comment: occasional  . Drug use: No  . Sexual activity: Not Currently    Birth control/ protection: Surgical   Other Topics Concern  . Not on file   Social History Narrative   [place]oved from [place].   2 children.  5 grandchildren.   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       Family History  Problem Relation Age of Onset  . Hypertension Father   . Hypertension Sister   . Diabetes Maternal Aunt   . Breast cancer Paternal Grandmother [phone]    The following portions of the patient's history were reviewed and updated as appropriate: allergies, current medications, past family history, past medical history, past social history, past surgical history and problem list.  Review of Systems Pertinent items noted in HPI and remainder of comprehensive ROS otherwise negative.   Objective:  BP 128/87 (BP Location: Left Arm, Patient Position: Sitting, Cuff Size: Normal)   Pulse (!) 111  Resp 18   Ht 5\' 5"  (1.651 m)   Wt 234 lb (106.1 kg)   BMI 38.94 kg/m  CONSTITUTIONAL: Well-developed, well-nourished female in no acute distress.  HENT:  Normocephalic, atraumatic, External right and left ear normal. Oropharynx is clear and moist EYES: Conjunctivae and EOM are normal. Pupils are equal, round, and reactive to light. No scleral icterus.  NECK: Normal range of motion, supple, no masses.  Normal thyroid.  SKIN: Skin is warm and dry. No rash noted. Not diaphoretic. No erythema. No pallor. NEUROLOGIC: Alert and oriented to person, place, and time. Normal reflexes, muscle tone coordination. No cranial nerve deficit noted. PSYCHIATRIC: Normal mood and affect. Normal behavior. Normal judgment and thought content. CARDIOVASCULAR: Normal heart rate noted, regular rhythm RESPIRATORY: Clear to auscultation bilaterally. Effort and breath  sounds normal, no problems with respiration noted. BREASTS: Symmetric in size. No masses, skin changes, nipple drainage, or lymphadenopathy. ABDOMEN: Soft, normal bowel sounds, no distention noted.  No tenderness, rebound or guarding.  PELVIC: Normal appearing external genitalia; normal appearing vaginal mucosa and cuff. No abnormal discharge noted. No other palpable masses, no adnexal tenderness. MUSCULOSKELETAL: Normal range of motion. No tenderness.  No cyanosis, clubbing, or edema.  2+ distal pulses.   Assessment:  Annual gynecologic examination without pap smear   Plan:  Up to date on mammography screening; will get another next week. Routine preventative health maintenance measures emphasized. Please refer to After Visit Summary for other counseling recommendations.    Jaynie Collins, MD, FACOG Attending Obstetrician & Gynecologist, [facility]

## 2016-11-12 NOTE — Patient Instructions (Signed)
Return to clinic for any scheduled appointments or for any gynecologic concerns as needed.   

## 2016-11-15 ENCOUNTER — Encounter: Payer: Self-pay | Admitting: Obstetrics & Gynecology

## 2016-11-20 DIAGNOSIS — M545 Low back pain: Secondary | ICD-10-CM | POA: Diagnosis not present

## 2016-12-06 ENCOUNTER — Encounter: Payer: Self-pay | Admitting: Primary Care

## 2016-12-06 ENCOUNTER — Other Ambulatory Visit: Payer: Self-pay | Admitting: Primary Care

## 2016-12-06 DIAGNOSIS — E785 Hyperlipidemia, unspecified: Secondary | ICD-10-CM

## 2016-12-06 DIAGNOSIS — I10 Essential (primary) hypertension: Secondary | ICD-10-CM

## 2016-12-06 MED ORDER — ATORVASTATIN CALCIUM 40 MG PO TABS: 40.0000 mg | ORAL_TABLET | Freq: Every day | ORAL | 0 refills | Status: DC

## 2016-12-06 MED ORDER — HYDROCHLOROTHIAZIDE 25 MG PO TABS
25.0000 mg | ORAL_TABLET | Freq: Every day | ORAL | 0 refills | Status: DC
Start: 2016-12-06 — End: 2017-06-27

## 2016-12-21 DIAGNOSIS — M545 Low back pain: Secondary | ICD-10-CM | POA: Diagnosis not present

## 2016-12-28 ENCOUNTER — Encounter: Payer: Self-pay | Admitting: Primary Care

## 2016-12-28 NOTE — Telephone Encounter (Signed)
Please see my chart message and get her scheduled. Thanks.

## 2016-12-28 NOTE — Telephone Encounter (Signed)
Schedule acute visit on 12/29/2016

## 2016-12-29 ENCOUNTER — Ambulatory Visit (INDEPENDENT_AMBULATORY_CARE_PROVIDER_SITE_OTHER): Payer: BLUE CROSS/BLUE SHIELD | Admitting: Primary Care

## 2016-12-29 ENCOUNTER — Encounter: Payer: Self-pay | Admitting: Primary Care

## 2016-12-29 VITALS — BP 128/84 | HR 98 | Temp 98.5°F | Ht 65.0 in | Wt 231.1 lb

## 2016-12-29 DIAGNOSIS — G8929 Other chronic pain: Secondary | ICD-10-CM

## 2016-12-29 DIAGNOSIS — M542 Cervicalgia: Secondary | ICD-10-CM | POA: Diagnosis not present

## 2016-12-29 DIAGNOSIS — M5442 Lumbago with sciatica, left side: Secondary | ICD-10-CM | POA: Diagnosis not present

## 2016-12-29 MED ORDER — DICLOFENAC SODIUM 75 MG PO TBEC: 75.0000 mg | DELAYED_RELEASE_TABLET | Freq: Two times a day (BID) | ORAL | 0 refills | Status: DC

## 2016-12-29 NOTE — Patient Instructions (Signed)
Stop by the front desk and speak with either Shirlee LimerickMarion or Revonda StandardAllison regarding your referral to Neurosurgery.  You may take diclofenac 75 mg tablets twice daily as needed for back and neck pain.   Use a heating pad, try exercising when possible.   It was a pleasure to see you today!

## 2016-12-29 NOTE — Progress Notes (Signed)
Pre visit review using our clinic review tool, if applicable. No additional management support is needed unless otherwise documented below in the visit note. 

## 2016-12-29 NOTE — Progress Notes (Signed)
Subjective:    Patient ID: Barbara Vasquez, female    DOB: [birth year], 46 y.o.   MRN: [id number]  HPI  Barbara Vasquez is a 46 year old female with a history of chronic low back pain who presents today with a chief complaint of neck and low back pain. MRI to lumbar spine in December 2017 with mild spondylosis and DDD.  She completed physical therapy in early/late Fall 2017 without improvement. She continues to experience pain to her left lower back with radiation to her left lower extremity with numbness/tinlging. She had no improvement with Meloxicam. Two weeks ago she noticed a sharp pain to her neck with tingling that is constant. The pain will start from her lower back, radiate up to her left shoulder, then to her neck. She's not been to work since 12/20/16.   She's seen an orthopedist several times since late Fall 2017 who recommended steroid injections. She is against steroids as she doesn't wish to gain any weight. She's not heard back from their office regarding the next step and is frustrated. She would like a second opinion. She is not exercising much due to the pain. She denies weakness, recent injury/trauma.  Review of Systems  Musculoskeletal: Positive for back pain and neck pain.  Skin: Negative for color change.  Neurological: Positive for numbness. Negative for weakness.       Past Medical History:  Diagnosis Date  . Essential hypertension   . Heart murmur    as child  . Hypercholesteremia      Social History   Social History  . Marital status: Single    Spouse name: N/A  . Number of children: N/A  . Years of education: N/A   Occupational History  . Not on file.   Social History Main Topics  . Smoking status: Never Smoker  . Smokeless tobacco: Never Used  . Alcohol use Yes     Comment: occasional  . Drug use: No  . Sexual activity: Not Currently    Birth control/ protection: Surgical   Other Topics Concern  . Not on file   Social History Narrative    Moved from [place].   2 children.  5 grandchildren.   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       Past Surgical History:  Procedure Laterality Date  . ABDOMINAL HYSTERECTOMY    . CHOLECYSTECTOMY      Family History  Problem Relation Age of Onset  . Hypertension Father   . Hypertension Sister   . Diabetes Maternal Aunt   . Breast cancer Paternal Grandmother 58    Allergies  Allergen Reactions  . Penicillins Swelling    Swelling of the throat    Current Outpatient Prescriptions on File Prior to Visit  Medication Sig Dispense Refill  . atorvastatin (LIPITOR) 40 MG tablet Take 1 tablet (40 mg total) by mouth at bedtime. 90 tablet 0  . hydrochlorothiazide (HYDRODIURIL) 25 MG tablet Take 1 tablet (25 mg total) by mouth daily. 90 tablet 0  . cyclobenzaprine (FLEXERIL) 5 MG tablet Take 1 tablet by mouth at bedtime as needed for muscle spasms. 30 tablet 1   No current facility-administered medications on file prior to visit.     BP 128/84   Pulse 98   Temp 98.5 F (36.9 C) (Oral)   Ht 5\' 5"  (1.651 m)   Wt 231 lb 1.9 oz (104.8 kg)   SpO2 97%   BMI  38.46 kg/m    Objective:   Physical Exam  Constitutional: She appears well-nourished.  Cardiovascular: Normal rate and regular rhythm.   Pulmonary/Chest: Effort normal and breath sounds normal.  Musculoskeletal:       Left shoulder: She exhibits normal range of motion, no tenderness and no pain.       Cervical back: She exhibits pain. She exhibits normal range of motion and no tenderness.       Lumbar back: She exhibits decreased range of motion and pain. She exhibits no tenderness and no spasm.  Strength equal to bilateral upper and lower extremities.  Skin: Skin is warm and dry.          Assessment & Plan:  Neck Pain:  Acute x 2 weeks.  No injury or weakness. No recent viral/bacterial involvement, no stiffness. Suspect related to chronic low back pain. Good ROM to neck and  shoulders on exam. Offered oral steroids for treatment for which she declines. Will switch Meloxicam to Diclofenac to see if she experiences improvement. Discussed application of heat, stretching.  Barbara Vasquez,Barbara Shankland Kendal, NP

## 2016-12-29 NOTE — Assessment & Plan Note (Signed)
No improvement with physical therapy. Declines injections offered by ortho. Discussed today that injections are a good option for her symptoms and may be beneficial. She declines oral steroids today for acute neck pain.  Referral placed to neurosurgery for further evaluation.

## 2017-01-10 ENCOUNTER — Telehealth: Payer: Self-pay | Admitting: Primary Care

## 2017-01-10 NOTE — Telephone Encounter (Signed)
Patient called to cancel her upcoming appointment.  Patient is moving.

## 2017-01-10 NOTE — Telephone Encounter (Signed)
Noted  

## 2017-01-18 DIAGNOSIS — M545 Low back pain: Secondary | ICD-10-CM | POA: Diagnosis not present

## 2017-01-31 ENCOUNTER — Ambulatory Visit: Payer: Self-pay | Admitting: Primary Care

## 2017-02-18 DIAGNOSIS — M545 Low back pain: Secondary | ICD-10-CM | POA: Diagnosis not present

## 2017-03-20 DIAGNOSIS — M545 Low back pain: Secondary | ICD-10-CM | POA: Diagnosis not present

## 2017-04-20 DIAGNOSIS — M545 Low back pain: Secondary | ICD-10-CM | POA: Diagnosis not present

## 2017-06-05 ENCOUNTER — Encounter: Payer: Self-pay | Admitting: Primary Care

## 2017-06-13 ENCOUNTER — Ambulatory Visit: Payer: BLUE CROSS/BLUE SHIELD | Admitting: Primary Care

## 2017-06-27 ENCOUNTER — Ambulatory Visit (INDEPENDENT_AMBULATORY_CARE_PROVIDER_SITE_OTHER): Payer: BLUE CROSS/BLUE SHIELD | Admitting: Primary Care

## 2017-06-27 ENCOUNTER — Encounter: Payer: Self-pay | Admitting: Primary Care

## 2017-06-27 VITALS — BP 144/94 | HR 101 | Temp 98.5°F | Ht 65.0 in | Wt 220.0 lb

## 2017-06-27 DIAGNOSIS — R7303 Prediabetes: Secondary | ICD-10-CM | POA: Diagnosis not present

## 2017-06-27 DIAGNOSIS — M25512 Pain in left shoulder: Secondary | ICD-10-CM

## 2017-06-27 DIAGNOSIS — E785 Hyperlipidemia, unspecified: Secondary | ICD-10-CM | POA: Diagnosis not present

## 2017-06-27 DIAGNOSIS — I10 Essential (primary) hypertension: Secondary | ICD-10-CM

## 2017-06-27 MED ORDER — NAPROXEN 500 MG PO TABS: 500.0000 mg | ORAL_TABLET | Freq: Two times a day (BID) | ORAL | 0 refills | Status: DC | PRN

## 2017-06-27 MED ORDER — HYDROCHLOROTHIAZIDE 25 MG PO TABS: 25.0000 mg | ORAL_TABLET | Freq: Every day | ORAL | 3 refills | Status: DC

## 2017-06-27 NOTE — Assessment & Plan Note (Signed)
Due for repeat lipids today.  

## 2017-06-27 NOTE — Progress Notes (Signed)
Subjective:    Patient ID: Barbara Vasquez, female    DOB: June 01, 1971, 46 y.o.   MRN: [id number]  HPI  [name] is a 46 year old female who presents today for follow up.  1) Essential Hypertension: She is currently managed on hydrochlorothiazide 25 mg. Her BP in the office today is 144/94. She ran out of the HCTZ one month ago and did not refill due to insurance purposes. She's noticed headaches intermittently since she's been off of her HCTZ. She now has insurance and is able to get the prescription.  2) Prediabetes: Noted on labs from April 2017, little improvement in August 2017. No recent A1C on file. History of borderline hyperlipidemia.   3) Shoulder Pain: Located to the left shoulder that began yesterday. She was lifting a lot of heavy boxes yesterday and early this morning as she recently moved into a new apartment. She denies numbness/tngling, weakness. She's taken one flexeril without improvement.   Review of Systems  Respiratory: Negative for shortness of breath.   Cardiovascular: Negative for chest pain.  Musculoskeletal:       Left shoulder pain  Neurological: Positive for headaches. Negative for dizziness, weakness and numbness.       Past Medical History:  Diagnosis Date  . Essential hypertension   . Heart murmur    as child  . Hypercholesteremia      Social History   Social History  . Marital status: Single    Spouse name: N/A  . Number of children: N/A  . Years of education: N/A   Occupational History  . Not on file.   Social History Main Topics  . Smoking status: Never Smoker  . Smokeless tobacco: Never Used  . Alcohol use Yes     Comment: occasional  . Drug use: No  . Sexual activity: Not Currently    Birth control/ protection: Surgical   Other Topics Concern  . Not on file   Social History Narrative   Moved from [place].   2 children.  5 grandchildren.   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      Past Surgical History:  Procedure Laterality Date  . ABDOMINAL HYSTERECTOMY    . CHOLECYSTECTOMY      Family History  Problem Relation Age of Onset  . Hypertension Father   . Hypertension Sister   . Diabetes Maternal Aunt   . Breast cancer Paternal Grandmother 80    Allergies  Allergen Reactions  . Penicillins Swelling    Swelling of the throat    Current Outpatient Prescriptions on File Prior to Visit  Medication Sig Dispense Refill  . atorvastatin (LIPITOR) 40 MG tablet Take 1 tablet (40 mg total) by mouth at bedtime. 90 tablet 0  . cyclobenzaprine (FLEXERIL) 5 MG tablet Take 1 tablet by mouth at bedtime as needed for muscle spasms. 30 tablet 1   No current facility-administered medications on file prior to visit.     BP (!) 144/94   Pulse (!) 101   Temp 98.5 F (36.9 C) (Oral)   Ht 5\' 5"  (1.651 m)   Wt 220 lb (99.8 kg)   SpO2 95%   BMI 36.61 kg/m    Objective:   Physical Exam  Constitutional: She appears well-nourished.  Neck: Neck supple.  Cardiovascular: Normal rate and regular rhythm.   Pulmonary/Chest: Effort normal and breath sounds normal.  Musculoskeletal:       Left shoulder: She exhibits  decreased range of motion and pain. She exhibits no tenderness.  Pain with forward abduction and posterior abduction. Strength 5/5 bilateral upper extremities on exam.  Skin: Skin is warm and dry.          Assessment & Plan:  Acute shoulder pain:  Located to left shoulder 24 hours. Suspect bursitis and will treat with conservative measures. Rx for naproxen course sent to pharmacy. Discussed stretching, ice/heat. She will update if no improvement in 1-2 weeks.  Morrie Sheldon, NP

## 2017-06-27 NOTE — Assessment & Plan Note (Signed)
Above goal in the office today, office HCTZ 1 month. Check BMP today. Perception for HCTZ sent to pharmacy. Will call in 2 weeks to check on blood pressure readings.

## 2017-06-27 NOTE — Patient Instructions (Signed)
Restart your hydrochlorothiazide tablets for high blood pressure.   Monitor your blood pressure and I'll call you for readings in 2 weeks.  Complete lab work prior to leaving today. I will notify you of your results once received.   You may take Naproxen twice daily with food as needed for shoulder pain.   It was a pleasure to see you today!

## 2017-06-27 NOTE — Assessment & Plan Note (Signed)
History of since April 2017. Due for repeat A1c today. Pending.

## 2017-06-28 ENCOUNTER — Other Ambulatory Visit: Payer: Self-pay | Admitting: Primary Care

## 2017-06-28 DIAGNOSIS — E785 Hyperlipidemia, unspecified: Secondary | ICD-10-CM

## 2017-06-28 LAB — COMPREHENSIVE METABOLIC PANEL
ALBUMIN: 4 g/dL (ref 3.5–5.2)
ALT: 14 U/L (ref 0–35)
AST: 17 U/L (ref 0–37)
Alkaline Phosphatase: 68 U/L (ref 39–117)
BILIRUBIN TOTAL: 0.5 mg/dL (ref 0.2–1.2)
BUN: 15 mg/dL (ref 6–23)
CALCIUM: 9.3 mg/dL (ref 8.4–10.5)
CO2: 28 mEq/L (ref 19–32)
CREATININE: 1.09 mg/dL (ref 0.40–1.20)
Chloride: 104 mEq/L (ref 96–112)
GFR: 69.09 mL/min (ref >60.00)
Glucose, Bld: 90 mg/dL (ref 70–99)
Potassium: 3.4 mEq/L — ABNORMAL LOW (ref 3.5–5.1)
Sodium: 139 mEq/L (ref 135–145)
TOTAL PROTEIN: 7.3 g/dL (ref 6.0–8.3)

## 2017-06-28 LAB — LIPID PANEL
CHOLESTEROL: 234 mg/dL — AB (ref 0–200)
HDL: 35.8 mg/dL — ABNORMAL LOW (ref >39.00)
LDL Cholesterol: 175 mg/dL — ABNORMAL HIGH (ref 0–99)
NonHDL: 198.16
TRIGLYCERIDES: 115 mg/dL (ref 0.0–149.0)
Total CHOL/HDL Ratio: 7
VLDL: 23 mg/dL (ref 0.0–40.0)

## 2017-06-28 LAB — HEMOGLOBIN A1C: HEMOGLOBIN A1C: 5.5 % (ref 4.6–6.5)

## 2017-06-28 NOTE — Assessment & Plan Note (Signed)
Above goal on recent labs, likely not taking atorvastatin as she has not refilled it since February 2018. We'll have her restart atorvastatin, repeat labs in 6 weeks.

## 2017-06-29 MED ORDER — ATORVASTATIN CALCIUM 40 MG PO TABS: 40.0000 mg | ORAL_TABLET | Freq: Every day | ORAL | 1 refills | Status: DC

## 2017-06-29 NOTE — Addendum Note (Signed)
Addended by: Tawnya CrookSAMBATH, Nicholis Stepanek on: 06/29/2017 06:40 PM   Modules accepted: Orders

## 2017-07-11 ENCOUNTER — Telehealth: Payer: Self-pay | Admitting: Primary Care

## 2017-07-11 NOTE — Telephone Encounter (Signed)
-----   Message from Doreene NestKatherine K Raynaldo Falco, NP sent at 06/27/2017  4:12 PM EDT ----- Regarding: BP Please check on patient's BP since we resumed her HCTZ.

## 2017-07-12 NOTE — Telephone Encounter (Signed)
Message left for patient to return my call.  

## 2017-07-14 NOTE — Telephone Encounter (Signed)
Message left for patient to return my call.  

## 2017-07-15 ENCOUNTER — Encounter: Payer: Self-pay | Admitting: Primary Care

## 2017-07-15 NOTE — Telephone Encounter (Signed)
Patient called back and could not speak to patient. She will send a MyChart message.

## 2017-08-12 ENCOUNTER — Other Ambulatory Visit: Payer: BLUE CROSS/BLUE SHIELD

## 2017-08-19 ENCOUNTER — Other Ambulatory Visit (INDEPENDENT_AMBULATORY_CARE_PROVIDER_SITE_OTHER): Payer: BLUE CROSS/BLUE SHIELD

## 2017-08-19 DIAGNOSIS — E785 Hyperlipidemia, unspecified: Secondary | ICD-10-CM

## 2017-08-19 LAB — LIPID PANEL
Cholesterol: 155 mg/dL (ref 0–200)
HDL: 34.7 mg/dL — ABNORMAL LOW (ref >39.00)
LDL CALC: 86 mg/dL (ref 0–99)
NONHDL: 119.91
Total CHOL/HDL Ratio: 4
Triglycerides: 169 mg/dL — ABNORMAL HIGH (ref 0.0–149.0)
VLDL: 33.8 mg/dL (ref 0.0–40.0)

## 2017-08-19 LAB — COMPREHENSIVE METABOLIC PANEL
ALT: 30 U/L (ref 0–35)
AST: 20 U/L (ref 0–37)
Albumin: 4 g/dL (ref 3.5–5.2)
Alkaline Phosphatase: 91 U/L (ref 39–117)
BUN: 18 mg/dL (ref 6–23)
CHLORIDE: 99 meq/L (ref 96–112)
CO2: 31 mEq/L (ref 19–32)
Calcium: 9.5 mg/dL (ref 8.4–10.5)
Creatinine, Ser: 1.08 mg/dL (ref 0.40–1.20)
GFR: 69.79 mL/min (ref >60.00)
Glucose, Bld: 111 mg/dL — ABNORMAL HIGH (ref 70–99)
POTASSIUM: 3.6 meq/L (ref 3.5–5.1)
SODIUM: 139 meq/L (ref 135–145)
Total Bilirubin: 0.6 mg/dL (ref 0.2–1.2)
Total Protein: 7.4 g/dL (ref 6.0–8.3)

## 2017-09-29 ENCOUNTER — Encounter: Payer: Self-pay | Admitting: Radiology

## 2018-01-27 ENCOUNTER — Ambulatory Visit (INDEPENDENT_AMBULATORY_CARE_PROVIDER_SITE_OTHER): Payer: BLUE CROSS/BLUE SHIELD | Admitting: Obstetrics & Gynecology

## 2018-01-27 ENCOUNTER — Encounter: Payer: Self-pay | Admitting: Obstetrics & Gynecology

## 2018-01-27 VITALS — BP 134/84 | HR 93 | Ht 65.0 in | Wt 229.0 lb

## 2018-01-27 DIAGNOSIS — M25512 Pain in left shoulder: Secondary | ICD-10-CM

## 2018-01-27 DIAGNOSIS — Z01419 Encounter for gynecological examination (general) (routine) without abnormal findings: Secondary | ICD-10-CM

## 2018-01-27 MED ORDER — NAPROXEN 500 MG PO TABS: 500.0000 mg | ORAL_TABLET | Freq: Two times a day (BID) | ORAL | 12 refills | Status: DC | PRN

## 2018-01-27 NOTE — Progress Notes (Signed)
Pt here for routine GYN exam. She c/o left side abdominal pain. She would like refill on Flexeril/Naproxen. This was started by PCP but pt is not sure when she will be able to get appt with her.

## 2018-01-27 NOTE — Progress Notes (Signed)
Subjective:    Barbara EasternSharnette Vasquez is a 47 y.o. single P2 70(48 yo daughter and 48 yo son- 5 grands)  female who presents for an annual exam. The patient has no complaints today. She would like  HIV and Hep C testing. The patient is sexually active. GYN screening history: last pap: was normal. The patient wears seatbelts: yes. The patient participates in regular exercise: yes. Has the patient ever been transfused or tattooed?: no. The patient reports that there is not domestic violence in her life.   Menstrual History: OB History    Gravida  4   Para  2   Term  2   Preterm  0   AB  2   Living  2     SAB  1   TAB  1   Ectopic  0   Multiple  0   Live Births  2           Menarche age: [age] No LMP recorded. Patient has had a hysterectomy.    The following portions of the patient's history were reviewed and updated as appropriate: allergies, current medications, past family history, past medical history, past social history, past surgical history and problem list.  Review of Systems Pertinent items are noted in HPI.   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)   Objective:    BP 134/84   Pulse 93   Ht 5\' 5"  (1.651 m)   Wt 229 lb (103.9 kg)   BMI 38.11 kg/m   General Appearance:    Alert, cooperative, no distress, appears stated age  Head:    Normocephalic, without obvious abnormality, atraumatic  Eyes:    PERRL, conjunctiva/corneas clear, EOM's intact, fundi    benign, both eyes  Ears:    Normal TM's and external ear canals, both ears  Nose:   Nares normal, septum midline, mucosa normal, no drainage    or sinus tenderness  Throat:   Lips, mucosa, and tongue normal; teeth and gums normal  Neck:   Supple, symmetrical, trachea midline, no adenopathy;    thyroid:  no enlargement/tenderness/nodules; no carotid   bruit or JVD  Back:     Symmetric, no curvature, ROM  normal, no CVA tenderness  Lungs:     Clear to auscultation bilaterally, respirations unlabored  Chest Wall:    No tenderness or deformity   Heart:    Regular rate and rhythm, S1 and S2 normal, no murmur, rub   or gallop  Breast Exam:    No tenderness, masses, or nipple abnormality  Abdomen:     Soft, non-tender, bowel sounds active all four quadrants,    no masses, no organomegaly  Genitalia:    Normal female without lesion, discharge or tenderness, cuff normal, bimanual negative     Extremities:   Extremities normal, atraumatic, no cyanosis or edema  Pulses:   2+ and symmetric all extremities  Skin:   Skin color, texture, turgor normal, no rashes or lesions  Lymph nodes:   Cervical, supraclavicular, and axillary nodes normal  Neurologic:   CNII-XII intact, normal strength, sensation and reflexes    throughout  .    Assessment:    Healthy female exam.    Plan:     HIV and Hep C testing

## 2018-01-28 LAB — HEPATITIS C ANTIBODY: Hep C Virus Ab: <0.1 s/co ratio (ref 0.0–0.9)

## 2018-01-28 LAB — HIV ANTIBODY (ROUTINE TESTING W REFLEX): HIV SCREEN 4TH GENERATION: NONREACTIVE

## 2018-02-02 ENCOUNTER — Encounter: Payer: Self-pay | Admitting: Obstetrics & Gynecology

## 2018-02-27 ENCOUNTER — Encounter: Payer: Self-pay | Admitting: Primary Care

## 2018-02-27 IMAGING — CT CT MAXILLOFACIAL W/ CM
3 of 4 series · 15 of 47 positions shown, 18 images · IV contrast (agent unspecified)
Comparison: None

CLINICAL DATA: LEFT facial swelling beginning this morning,
continues to get bigger, LEFT facial pain, no known injury

EXAM:
CT MAXILLOFACIAL WITH CONTRAST
TECHNIQUE: Multidetector CT imaging of the maxillofacial structures was
performed with intravenous contrast. Multiplanar CT image
reconstructions were also generated. A small metallic BB was placed
on the right temple in order to reliably differentiate right from
left.

[Series 2: max soft · axial · 0.42mm/px · z∈[+66,+226]mm · 9 of 94 slices shown, 12 images]
[im 7/94  brain]
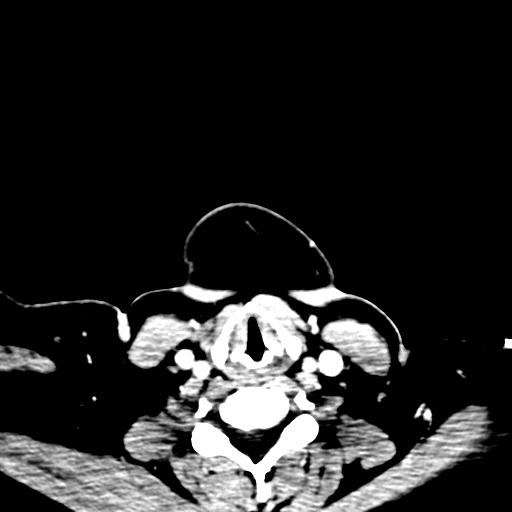
[im 7/94  bone]
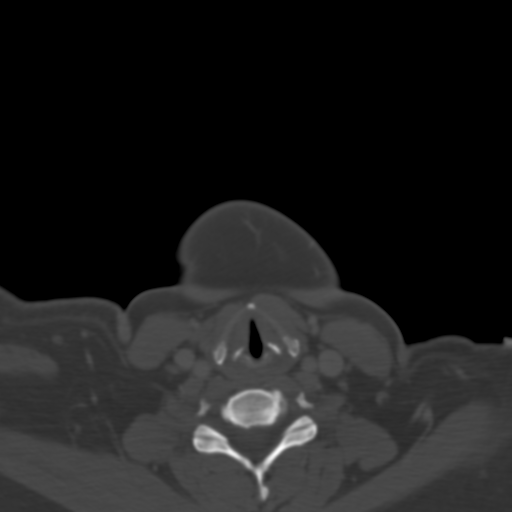
[im 17/94  bone]
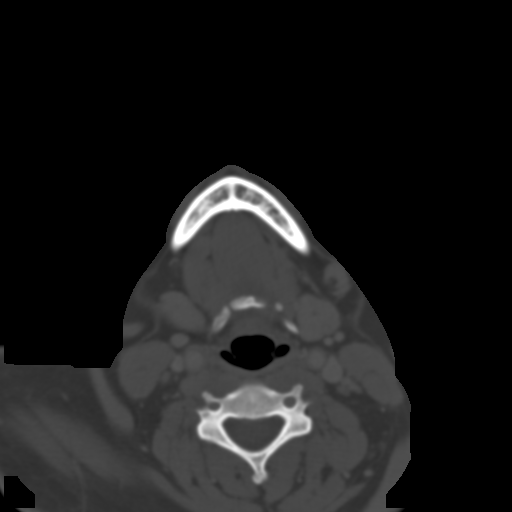
[im 26/94  bone]
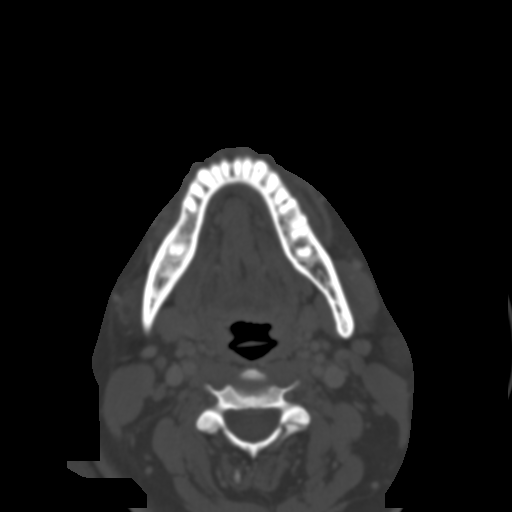
[im 36/94  bone]
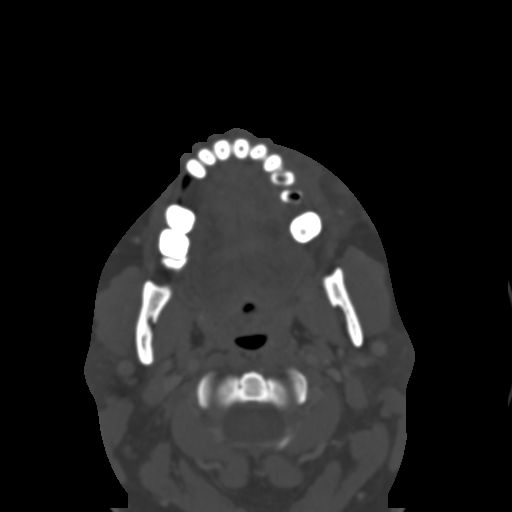
[im 49/94  brain]
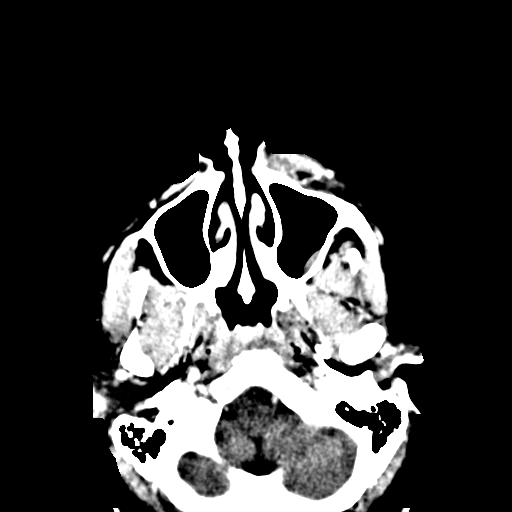
[im 49/94  bone]
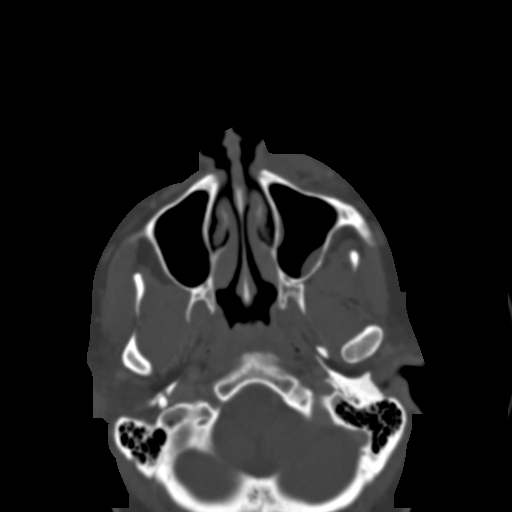
[im 58/94  bone]
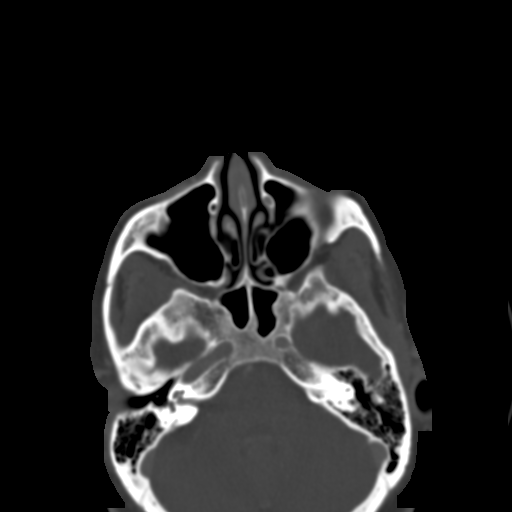
[im 68/94  bone]
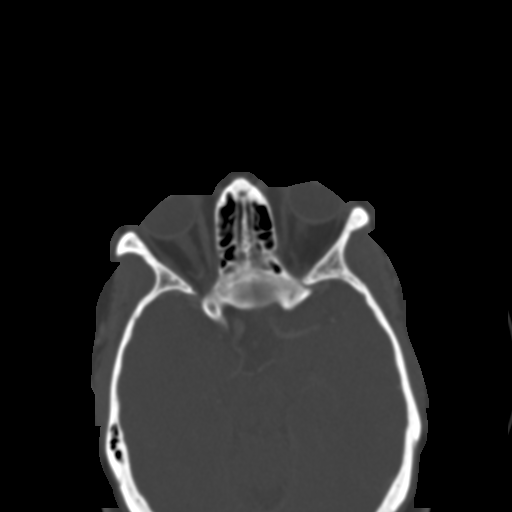
[im 77/94  bone]
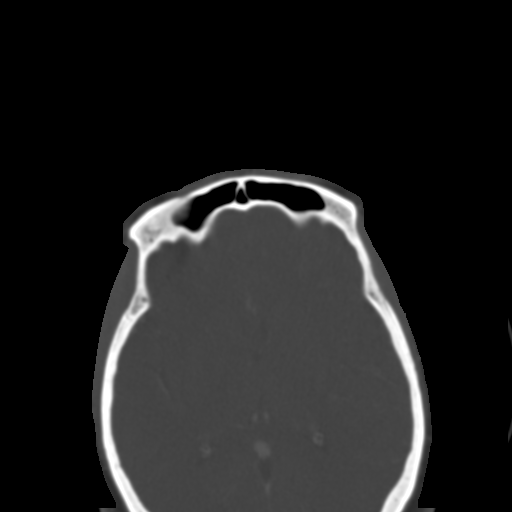
[im 87/94  brain]
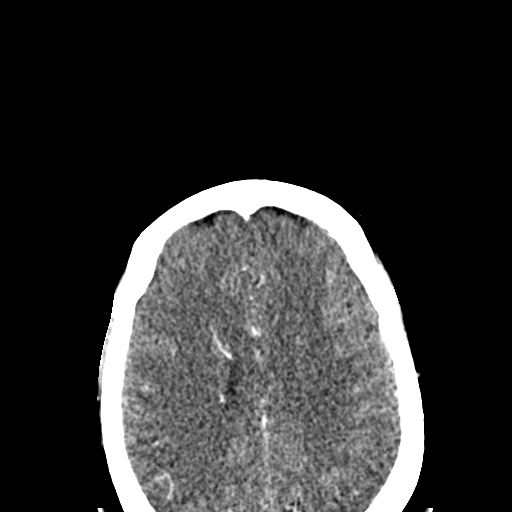
[im 87/94  bone]
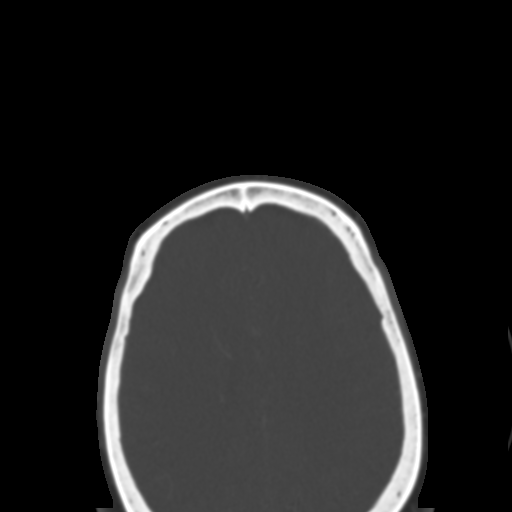

[Series 5: sagittal soft · sagittal · 0.37mm/px · 3 of 79 slices shown]
[im 27/79  bone]
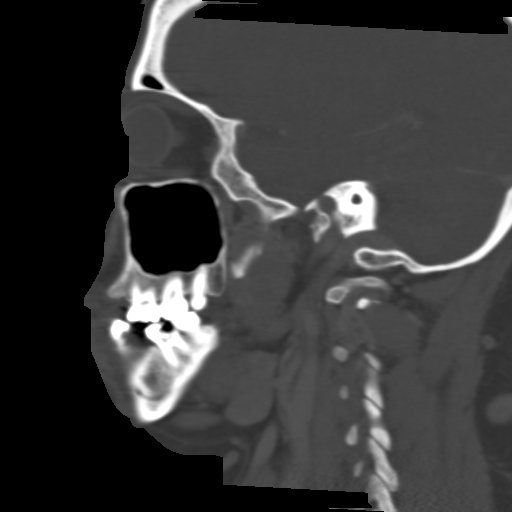
[im 40/79  bone]
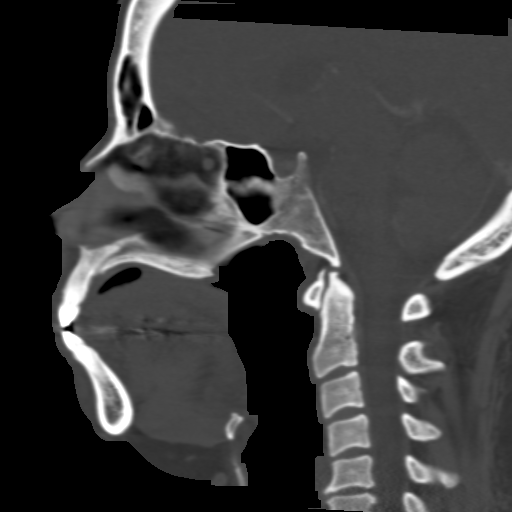
[im 53/79  bone]
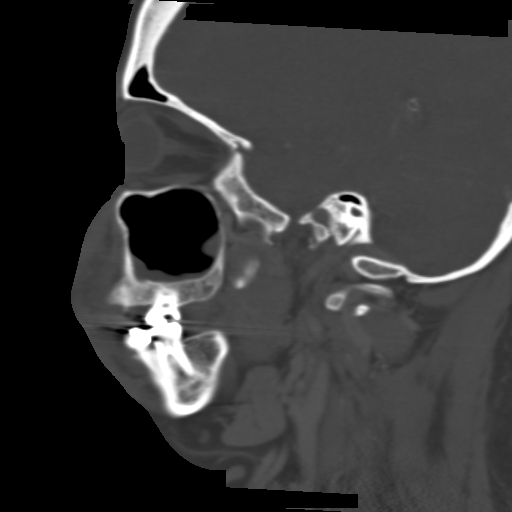

[Series 6: coronal bone · coronal · 0.39mm/px · 3 of 88 slices shown]
[im 22/88  bone]
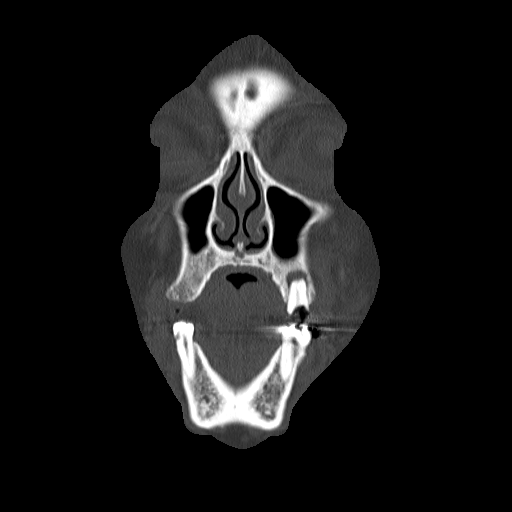
[im 44/88  bone]
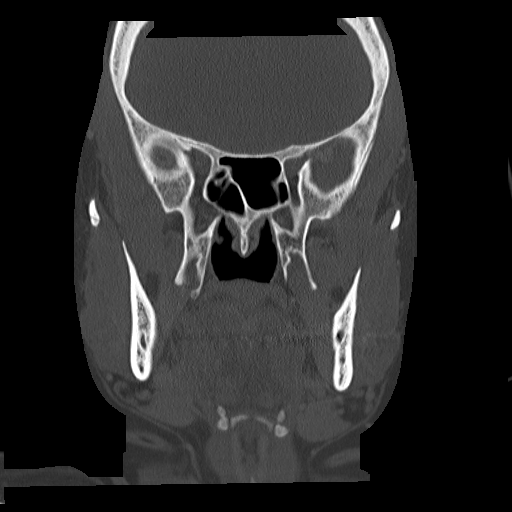
[im 66/88  bone]
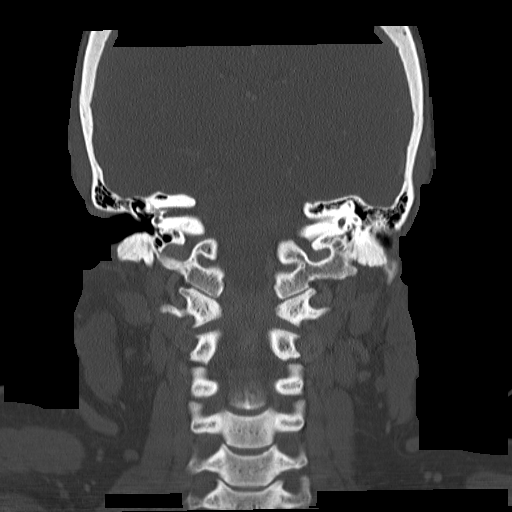

[15 of 47 positions shown; findings below may reference images not displayed]

CONTRAST:  75mL OMNIPAQUE IOHEXOL 300 MG/ML SOLN IV visualized
intracranial structures normal appearance.

Orbital soft tissue planes clear.

Significant LEFT facial soft tissue swelling pre maxillary extending
to the mandibular region.

Scattered beam hardening artifacts of dental origin.

Periodontal lucencies identified at the bases of teeth 13 and 12
FINDINGS: Visualized intracranial structures normal appearance.

Intraorbital soft tissue planes clear.

Significantly LEFT facial soft tissue swelling pre maxillary
extending to the perimandibular region.

No discrete loculated fluid collection is identified to suggest
abscess.

Scattered beam hardening artifacts of dental origin.

Periodontal lucency at the base of tooth #13 question periapical
abscess.

Subtle periodontal lucency at the base of tooth #12.

Multiple absent teeth.

Scattered dental caries.

Mucosal thickening LEFT maxillary sinus.

Remaining paranasal sinuses, mastoid air cells, and middle ear
cavities clear.

Vascular structures grossly patent.

Prevertebral soft tissues normal thickness.

Parapharyngeal spaces clear.

Scattered normal sized to mildly enlarged reactive cervical nodes,
largest 9 mm short axis LEFT submandibular image 77 and 9 mm short
axis LEFT Level II node image 68.

Symmetric parotid and submandibular glands.

Mild degenerative disc disease changes C5-C6.

No acute osseous findings otherwise identified.
IMPRESSION: LEFT facial cellulitis without discrete evidence of abscess.

Periodontal disease tooth #13, minimally tooth #12.

Scattered dental caries.

Mildly enlarged reactive LEFT cervical nodes.

## 2018-02-28 NOTE — Telephone Encounter (Signed)
Barbara Vasquez, see My Chart message below. Can you respond?

## 2018-03-01 ENCOUNTER — Encounter: Payer: Self-pay | Admitting: Primary Care

## 2018-03-10 ENCOUNTER — Ambulatory Visit
Admission: RE | Admit: 2018-03-10 | Discharge: 2018-03-10 | Disposition: A | Discharge status: Home / Self Care | Payer: BLUE CROSS/BLUE SHIELD | Source: Ambulatory Visit | Attending: Obstetrics & Gynecology | Admitting: Obstetrics & Gynecology

## 2018-03-10 DIAGNOSIS — Z1231 Encounter for screening mammogram for malignant neoplasm of breast: Secondary | ICD-10-CM | POA: Diagnosis not present

## 2018-03-10 DIAGNOSIS — Z01419 Encounter for gynecological examination (general) (routine) without abnormal findings: Secondary | ICD-10-CM

## 2018-03-22 ENCOUNTER — Encounter: Payer: Self-pay | Admitting: Primary Care

## 2018-03-22 DIAGNOSIS — M5442 Lumbago with sciatica, left side: Secondary | ICD-10-CM

## 2018-03-22 DIAGNOSIS — E785 Hyperlipidemia, unspecified: Secondary | ICD-10-CM

## 2018-03-23 ENCOUNTER — Encounter: Payer: Self-pay | Admitting: Primary Care

## 2018-03-23 MED ORDER — CYCLOBENZAPRINE HCL 5 MG PO TABS: ORAL_TABLET | ORAL | 1 refills | Status: DC

## 2018-03-23 MED ORDER — ATORVASTATIN CALCIUM 40 MG PO TABS: 40.0000 mg | ORAL_TABLET | Freq: Every day | ORAL | 0 refills | Status: DC

## 2018-03-24 ENCOUNTER — Other Ambulatory Visit: Payer: Self-pay | Admitting: Internal Medicine

## 2018-05-26 ENCOUNTER — Ambulatory Visit: Payer: BLUE CROSS/BLUE SHIELD | Admitting: Primary Care

## 2018-06-06 ENCOUNTER — Encounter: Payer: Self-pay | Admitting: Primary Care

## 2018-06-09 ENCOUNTER — Ambulatory Visit: Payer: BLUE CROSS/BLUE SHIELD | Admitting: Primary Care

## 2018-06-09 ENCOUNTER — Encounter: Payer: Self-pay | Admitting: Primary Care

## 2018-06-09 ENCOUNTER — Other Ambulatory Visit: Payer: Self-pay | Admitting: Primary Care

## 2018-06-09 ENCOUNTER — Encounter

## 2018-06-09 ENCOUNTER — Ambulatory Visit (INDEPENDENT_AMBULATORY_CARE_PROVIDER_SITE_OTHER)
Admission: RE | Admit: 2018-06-09 | Discharge: 2018-06-09 | Disposition: A | Discharge status: Home / Self Care | Payer: BLUE CROSS/BLUE SHIELD | Source: Ambulatory Visit | Attending: Primary Care | Admitting: Primary Care

## 2018-06-09 VITALS — BP 136/86 | HR 100 | Temp 99.0°F | Ht 65.0 in | Wt 225.0 lb

## 2018-06-09 DIAGNOSIS — R1032 Left lower quadrant pain: Secondary | ICD-10-CM

## 2018-06-09 DIAGNOSIS — E785 Hyperlipidemia, unspecified: Secondary | ICD-10-CM | POA: Diagnosis not present

## 2018-06-09 DIAGNOSIS — I10 Essential (primary) hypertension: Secondary | ICD-10-CM

## 2018-06-09 DIAGNOSIS — R739 Hyperglycemia, unspecified: Secondary | ICD-10-CM

## 2018-06-09 DIAGNOSIS — R7303 Prediabetes: Secondary | ICD-10-CM

## 2018-06-09 LAB — CBC WITH DIFFERENTIAL/PLATELET
BASOS PCT: 0.6 % (ref 0.0–3.0)
Basophils Absolute: 0 10*3/uL (ref 0.0–0.1)
EOS PCT: 0.9 % (ref 0.0–5.0)
Eosinophils Absolute: 0.1 10*3/uL (ref 0.0–0.7)
HCT: 39.5 % (ref 36.0–46.0)
Hemoglobin: 13.4 g/dL (ref 12.0–15.0)
LYMPHS PCT: 29 % (ref 12.0–46.0)
Lymphs Abs: 2.3 10*3/uL (ref 0.7–4.0)
MCHC: 33.9 g/dL (ref 30.0–36.0)
MCV: 94.5 fl (ref 78.0–100.0)
MONOS PCT: 6.5 % (ref 3.0–12.0)
Monocytes Absolute: 0.5 10*3/uL (ref 0.1–1.0)
NEUTROS ABS: 5.1 10*3/uL (ref 1.4–7.7)
Neutrophils Relative %: 63 % (ref 43.0–77.0)
PLATELETS: 356 10*3/uL (ref 150.0–400.0)
RBC: 4.18 Mil/uL (ref 3.87–5.11)
RDW: 12.9 % (ref 11.5–15.5)
WBC: 8.1 10*3/uL (ref 4.0–10.5)

## 2018-06-09 LAB — POC URINALSYSI DIPSTICK (AUTOMATED)
Bilirubin, UA: NEGATIVE
Glucose, UA: NEGATIVE
KETONES UA: NEGATIVE
Nitrite, UA: NEGATIVE
PROTEIN UA: POSITIVE — AB
RBC UA: NEGATIVE
UROBILINOGEN UA: 0.2 U/dL
pH, UA: 6 (ref 5.0–8.0)

## 2018-06-09 LAB — COMPREHENSIVE METABOLIC PANEL
ALT: 15 U/L (ref 0–35)
AST: 16 U/L (ref 0–37)
Albumin: 4 g/dL (ref 3.5–5.2)
Alkaline Phosphatase: 81 U/L (ref 39–117)
BUN: 13 mg/dL (ref 6–23)
CALCIUM: 9.3 mg/dL (ref 8.4–10.5)
CHLORIDE: 100 meq/L (ref 96–112)
CO2: 29 mEq/L (ref 19–32)
Creatinine, Ser: 1.04 mg/dL (ref 0.40–1.20)
GFR: 72.65 mL/min (ref >60.00)
Glucose, Bld: 127 mg/dL — ABNORMAL HIGH (ref 70–99)
Potassium: 3.4 mEq/L — ABNORMAL LOW (ref 3.5–5.1)
Sodium: 136 mEq/L (ref 135–145)
Total Bilirubin: 0.3 mg/dL (ref 0.2–1.2)
Total Protein: 7.5 g/dL (ref 6.0–8.3)

## 2018-06-09 LAB — LIPID PANEL
CHOL/HDL RATIO: 6
Cholesterol: 224 mg/dL — ABNORMAL HIGH (ref 0–200)
HDL: 37.4 mg/dL — AB (ref >39.00)
LDL CALC: 154 mg/dL — AB (ref 0–99)
NonHDL: 186.84
TRIGLYCERIDES: 164 mg/dL — AB (ref 0.0–149.0)
VLDL: 32.8 mg/dL (ref 0.0–40.0)

## 2018-06-09 MED ORDER — CIPROFLOXACIN HCL 500 MG PO TABS: 500.0000 mg | ORAL_TABLET | Freq: Two times a day (BID) | ORAL | 0 refills | Status: DC

## 2018-06-09 NOTE — Progress Notes (Signed)
Subjective:    Patient ID: Barbara Vasquez, female    DOB: 08-Jan-1971, 47 y.o.   MRN: [id number]  HPI  Barbara Vasquez is a 47 year old female with a history of chronic back pain who presents today with a chief complaint of left lower back and left lower quadrant abdominal pain. She is also due for repeat lipid check.   Located to left lower quadrant/suprapbic region. Her pain initially began to her left lower back three days ago, had to leave work early and call out the following day. She describes her pain as sharp and twisting pain to the left lower back. Her pain then reduced to the left lower back, moved to the left lower abdomen the following day. Her left lower back pain felt different than her typical pain and is still there but on a lesser scale.   She's taken Ibuprofen 800 mg and Naproxen without improvement.   She does notice urinary frequency and suprapubic pressure to the left side. She denies hematuria, vaginal itching, vaginal discharge, foul smelling urine, diarrhea, nausea, vomiting. She has noticed soft stools and pressure with bowel movement this morning.   Wt Readings from Last 3 Encounters:  06/09/18 225 lb (102.1 kg)  01/27/18 229 lb (103.9 kg)  06/27/17 220 lb (99.8 kg)     Review of Systems  Constitutional: Negative for fever.  Gastrointestinal: Positive for abdominal pain. Negative for diarrhea, nausea and vomiting.  Genitourinary: Positive for frequency. Negative for dysuria, hematuria and vaginal discharge.       Past Medical History:  Diagnosis Date  . Essential hypertension   . Heart murmur    as child  . Hypercholesteremia      Social History   Socioeconomic History  . Marital status: Single    Spouse name: Not on file  . Number of children: Not on file  . Years of education: Not on file  . Highest education level: Not on file  Occupational History  . Not on file  Social Needs  . Financial resource strain: Not on file  . Food  insecurity:    Worry: Not on file    Inability: Not on file  . Transportation needs:    Medical: Not on file    Non-medical: Not on file  Tobacco Use  . Smoking status: Never Smoker  . Smokeless tobacco: Never Used  Substance and Sexual Activity  . Alcohol use: Yes    Comment: occasional  . Drug use: No  . Sexual activity: Yes    Birth control/protection: Surgical  Lifestyle  . Physical activity:    Days per week: Not on file    Minutes per session: Not on file  . Stress: Not on file  Relationships  . Social connections:    Talks on phone: Not on file    Gets together: Not on file    Attends religious service: Not on file    Active member of club or organization: Not on file    Attends meetings of clubs or organizations: Not on file    Relationship status: Not on file  . Intimate partner violence:    Fear of current or ex partner: Not on file    Emotionally abused: Not on file    Physically abused: Not on file    Forced sexual activity: Not on file  Other Topics Concern  . Not on file  Social History Narrative   Moved from [place].   2 children.  5 grandchildren.  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    Past Surgical History:  Procedure Laterality Date  . ABDOMINAL HYSTERECTOMY    . CHOLECYSTECTOMY      Family History  Problem Relation Age of Onset  . Hypertension Father   . Hypertension Sister   . Diabetes Maternal Aunt   . Breast cancer Paternal Grandmother [id number]    Allergies  Allergen Reactions  . Penicillins Swelling    Swelling of the throat    Current Outpatient Medications on File Prior to Visit  Medication Sig Dispense Refill  . atorvastatin (LIPITOR) 40 MG tablet Take 1 tablet (40 mg total) by mouth at bedtime. 90 tablet 0  . cyclobenzaprine (FLEXERIL) 5 MG tablet Take 1 tablet by mouth at bedtime as needed for muscle spasms. 30 tablet 1  . hydrochlorothiazide (HYDRODIURIL) 25 MG tablet Take 1 tablet (25 mg  total) by mouth daily. 90 tablet 3  . naproxen (NAPROSYN) 500 MG tablet Take 1 tablet (500 mg total) by mouth 2 (two) times daily as needed for moderate pain. Take with food. 30 tablet 12   No current facility-administered medications on file prior to visit.     BP 136/86   Pulse 100   Temp 99 F (37.2 C) (Oral)   Ht 5\' 5"  (1.651 m)   Wt 225 lb (102.1 kg)   SpO2 96%   BMI 37.44 kg/m    Objective:   Physical Exam  Constitutional: She appears well-nourished.  Neck: Neck supple.  Cardiovascular: Normal rate.  Respiratory: Effort normal.  GI: Soft. Normal appearance and bowel sounds are normal. There is tenderness in the suprapubic area and left lower quadrant. There is no CVA tenderness.    Skin: Skin is warm and dry.           Assessment & Plan:  Abdominal Pain:  Located to LLQ/suprapubic region x 2-3 days. Also to left lower back. Differentials include: renal stone, acute cystitis, diverticulitis, colitis. UA today: 1+ leuks, negative nitrites, no blood. Culture sent. Rx for Cipro course sent to pharmacy.  Check CBC, CMP and plain films of abdomen. ED precautions provided over the weekend. She appears well and stable today.  Doreene NestKatherine K Juanmanuel Marohl, NP

## 2018-06-09 NOTE — Patient Instructions (Signed)
Stop by the lab and xray prior to leaving today. I will notify you of your results once received.   Start ciprofloxacin antibiotics for the infection. Take 1 tablet by mouth twice daily for 5 days.  Make sure to drink plenty of water and rest.  It was a pleasure to see you today!

## 2018-06-11 LAB — URINE CULTURE
MICRO NUMBER:: 90945909
Result:: NO GROWTH
SPECIMEN QUALITY:: ADEQUATE

## 2018-06-12 DIAGNOSIS — M545 Low back pain: Secondary | ICD-10-CM

## 2018-06-12 MED ORDER — METHOCARBAMOL 500 MG PO TABS: 500.0000 mg | ORAL_TABLET | Freq: Three times a day (TID) | ORAL | 0 refills | Status: DC | PRN

## 2018-06-13 ENCOUNTER — Other Ambulatory Visit: Payer: Self-pay | Admitting: Primary Care

## 2018-06-13 DIAGNOSIS — G8929 Other chronic pain: Secondary | ICD-10-CM

## 2018-06-13 DIAGNOSIS — M5442 Lumbago with sciatica, left side: Principal | ICD-10-CM

## 2018-07-05 ENCOUNTER — Other Ambulatory Visit: Payer: Self-pay

## 2018-07-05 ENCOUNTER — Ambulatory Visit: Payer: BLUE CROSS/BLUE SHIELD | Admitting: Internal Medicine

## 2018-07-05 ENCOUNTER — Encounter: Payer: Self-pay | Admitting: Internal Medicine

## 2018-07-05 VITALS — BP 132/82 | HR 83 | Temp 98.4°F | Wt 226.0 lb

## 2018-07-05 DIAGNOSIS — R739 Hyperglycemia, unspecified: Secondary | ICD-10-CM

## 2018-07-05 DIAGNOSIS — E785 Hyperlipidemia, unspecified: Secondary | ICD-10-CM

## 2018-07-05 DIAGNOSIS — L239 Allergic contact dermatitis, unspecified cause: Secondary | ICD-10-CM

## 2018-07-05 DIAGNOSIS — I10 Essential (primary) hypertension: Secondary | ICD-10-CM

## 2018-07-05 LAB — POCT GLYCOSYLATED HEMOGLOBIN (HGB A1C): Hemoglobin A1C: 5.8 % — AB (ref 4.0–5.6)

## 2018-07-05 MED ORDER — ATORVASTATIN CALCIUM 40 MG PO TABS: 40.0000 mg | ORAL_TABLET | Freq: Every day | ORAL | 0 refills | Status: DC

## 2018-07-05 MED ORDER — HYDROCHLOROTHIAZIDE 25 MG PO TABS: 25.0000 mg | ORAL_TABLET | Freq: Every day | ORAL | 0 refills | Status: DC

## 2018-07-05 MED ORDER — PREDNISONE 10 MG PO TABS: ORAL_TABLET | ORAL | 0 refills | Status: DC

## 2018-07-05 NOTE — Patient Instructions (Signed)
Contact Dermatitis Dermatitis is redness, soreness, and swelling (inflammation) of the skin. Contact dermatitis is a reaction to certain substances that touch the skin. You either touched something that irritated your skin, or you have allergies to something you touched. Follow these instructions at home: Skin Care  Moisturize your skin as needed.  Apply cool compresses to the affected areas.  Try taking a bath with: ? Epsom salts. Follow the instructions on the package. You can get these at a pharmacy or grocery store. ? Baking soda. Pour a small amount into the bath as told by your doctor. ? Colloidal oatmeal. Follow the instructions on the package. You can get this at a pharmacy or grocery store.  Try applying baking soda paste to your skin. Stir water into baking soda until it looks like paste.  Do not scratch your skin.  Bathe less often.  Bathe in lukewarm water. Avoid using hot water. Medicines  Take or apply over-the-counter and prescription medicines only as told by your doctor.  If you were prescribed an antibiotic medicine, take or apply your antibiotic as told by your doctor. Do not stop taking the antibiotic even if your condition starts to get better. General instructions  Keep all follow-up visits as told by your doctor. This is important.  Avoid the substance that caused your reaction. If you do not know what caused it, keep a journal to try to track what caused it. Write down: ? What you eat. ? What cosmetic products you use. ? What you drink. ? What you wear in the affected area. This includes jewelry.  If you were given a bandage (dressing), take care of it as told by your doctor. This includes when to change and remove it. Contact a doctor if:  You do not get better with treatment.  Your condition gets worse.  You have signs of infection such as: ? Swelling. ? Tenderness. ? Redness. ? Soreness. ? Warmth.  You have a fever.  You have new  symptoms. Get help right away if:  You have a very bad headache.  You have neck pain.  Your neck is stiff.  You throw up (vomit).  You feel very sleepy.  You see red streaks coming from the affected area.  Your bone or joint underneath the affected area becomes painful after the skin has healed.  The affected area turns darker.  You have trouble breathing. This information is not intended to replace advice given to you by your health care provider. Make sure you discuss any questions you have with your health care provider. Document Released: 08/15/2009 Document Revised: 03/25/2016 Document Reviewed: 03/05/2015 Elsevier Interactive Patient Education  2018 [employer]  

## 2018-07-05 NOTE — Progress Notes (Signed)
Subjective:    Patient ID: Barbara Vasquez, female    DOB: April 10, 1971, 47 y.o.   MRN: [id number]  HPI  Pt presents to the clinic today with c/o a rash on her arms and legs. She noticed this 3 days ago. It has now spread to her back. The rash is itchy. She denies changes in soaps, lotions or detergents. She denies changes in diet or medications. She has a friend that lives with her that has a similar rash. They have been sanding wood in preparation to work on a product. She has tried Hydrocortisone and Benadryl OTC with minimal relief.   Review of Systems      Past Medical History:  Diagnosis Date  . Essential hypertension   . Heart murmur    as child  . Hypercholesteremia     Current Outpatient Medications  Medication Sig Dispense Refill  . atorvastatin (LIPITOR) 40 MG tablet Take 1 tablet (40 mg total) by mouth at bedtime. 90 tablet 0  . cyclobenzaprine (FLEXERIL) 5 MG tablet Take 1 tablet by mouth at bedtime as needed for muscle spasms. 30 tablet 1  . hydrochlorothiazide (HYDRODIURIL) 25 MG tablet Take 1 tablet (25 mg total) by mouth daily. 90 tablet 3  . methocarbamol (ROBAXIN) 500 MG tablet Take 1 tablet (500 mg total) by mouth every 8 (eight) hours as needed for muscle spasms. 30 tablet 0  . naproxen (NAPROSYN) 500 MG tablet Take 1 tablet (500 mg total) by mouth 2 (two) times daily as needed for moderate pain. Take with food. 30 tablet 12   No current facility-administered medications for this visit.     Allergies  Allergen Reactions  . Penicillins Swelling    Swelling of the throat    Family History  Problem Relation Age of Onset  . Hypertension Father   . Hypertension Sister   . Diabetes Maternal Aunt   . Breast cancer Paternal Grandmother 55    Social History   Socioeconomic History  . Marital status: Single    Spouse name: Not on file  . Number of children: Not on file  . Years of education: Not on file  . Highest education level: Not on file    Occupational History  . Not on file  Social Needs  . Financial resource strain: Not on file  . Food insecurity:    Worry: Not on file    Inability: Not on file  . Transportation needs:    Medical: Not on file    Non-medical: Not on file  Tobacco Use  . Smoking status: Never Smoker  . Smokeless tobacco: Never Used  Substance and Sexual Activity  . Alcohol use: Yes    Comment: occasional  . Drug use: No  . Sexual activity: Yes    Birth control/protection: Surgical  Lifestyle  . Physical activity:    Days per week: Not on file    Minutes per session: Not on file  . Stress: Not on file  Relationships  . Social connections:    Talks on phone: Not on file    Gets together: Not on file    Attends religious service: Not on file    Active member of club or organization: Not on file    Attends meetings of clubs or organizations: Not on file    Relationship status: Not on file  . Intimate partner violence:    Fear of current or ex partner: Not on file    Emotionally abused: Not on file  Physically abused: Not on file    Forced sexual activity: Not on file  Other Topics Concern  . Not on file  Social History Narrative   Moved from [place].   2 children.  5 grandchildren.   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     Constitutional: Denies fever, malaise, fatigue, headache or abrupt weight changes.  Skin: Pt reports rash. Denies ulcercations.    No other specific complaints in a complete review of systems (except as listed in HPI above).  Objective:   Physical Exam  BP 132/82   Pulse 83   Temp 98.4 F (36.9 C) (Oral)   Wt 226 lb (102.5 kg)   SpO2 97%   BMI 37.61 kg/m  Wt Readings from Last 3 Encounters:  07/05/18 226 lb (102.5 kg)  06/09/18 225 lb (102.1 kg)  01/27/18 229 lb (103.9 kg)    General: Appears her stated age, well developed, well nourished in NAD. Skin: Papular rash noted on bilateral forearms, bilateral upper  thighs and midline back. BMET    Component Value Date/Time   NA 136 06/09/2018 1130   K 3.4 (L) 06/09/2018 1130   CL 100 06/09/2018 1130   CO2 29 06/09/2018 1130   GLUCOSE 127 (H) 06/09/2018 1130   BUN 13 06/09/2018 1130   CREATININE 1.04 06/09/2018 1130   CREATININE 0.85 11/03/2015 1544   CALCIUM 9.3 06/09/2018 1130   GFRNONAA 58 (L) 01/13/2016 1039   GFRAA >60 01/13/2016 1039    Lipid Panel     Component Value Date/Time   CHOL 224 (H) 06/09/2018 1130   TRIG 164.0 (H) 06/09/2018 1130   HDL 37.40 (L) 06/09/2018 1130   CHOLHDL 6 06/09/2018 1130   VLDL 32.8 06/09/2018 1130   LDLCALC 154 (H) 06/09/2018 1130    CBC    Component Value Date/Time   WBC 8.1 06/09/2018 1130   RBC 4.18 06/09/2018 1130   HGB 13.4 06/09/2018 1130   HCT 39.5 06/09/2018 1130   PLT 356.0 06/09/2018 1130   MCV 94.5 06/09/2018 1130   MCH 31.2 01/13/2016 1039   MCHC 33.9 06/09/2018 1130   RDW 12.9 06/09/2018 1130   LYMPHSABS 2.3 06/09/2018 1130   MONOABS 0.5 06/09/2018 1130   EOSABS 0.1 06/09/2018 1130   BASOSABS 0.0 06/09/2018 1130    Hgb A1C Lab Results  Component Value Date   HGBA1C 5.5 06/27/2017            Assessment & Plan:   Allergic Dermatitis:  Offered 80 mg Depo IM today- she declines eRx for Pred Taper x 6 days Start Zyrtec OTC  Return precautions discussed Nicki Reaper, NP

## 2018-07-05 NOTE — Addendum Note (Signed)
Addended by: Alvina Chou on: 07/05/2018 04:21 PM   Modules accepted: Orders

## 2018-07-11 ENCOUNTER — Encounter: Payer: Self-pay | Admitting: Internal Medicine

## 2018-07-11 ENCOUNTER — Ambulatory Visit: Payer: BLUE CROSS/BLUE SHIELD | Admitting: Internal Medicine

## 2018-07-11 VITALS — BP 132/84 | HR 81 | Temp 98.9°F | Wt 224.0 lb

## 2018-07-11 DIAGNOSIS — L239 Allergic contact dermatitis, unspecified cause: Secondary | ICD-10-CM

## 2018-07-11 MED ORDER — CLOBETASOL PROPIONATE 0.05 % EX OINT: 1.0000 "application " | TOPICAL_OINTMENT | Freq: Two times a day (BID) | CUTANEOUS | 0 refills | Status: DC

## 2018-07-11 NOTE — Progress Notes (Signed)
Subjective:    Patient ID: Barbara Vasquez, female    DOB: Sep 19, 1971, 47 y.o.   MRN: [id number]  HPI  Pt presents to the clinic today with c/o a persistent rash. She reports this started about 10 days ago. It started on her arms and legs. It then spread to her back. The rash is itchy. She was seen 9/4 for the same and put on a 6 day Pred Taper. She reports no improvement in her rash. The rash is still spreading and very itchy. She denies changes in soaps, lotions or detergents. She is taking Benadryl OTC.  Review of Systems      Past Medical History:  Diagnosis Date  . Essential hypertension   . Heart murmur    as child  . Hypercholesteremia     Current Outpatient Medications  Medication Sig Dispense Refill  . atorvastatin (LIPITOR) 40 MG tablet Take 1 tablet (40 mg total) by mouth at bedtime. 90 tablet 0  . cyclobenzaprine (FLEXERIL) 5 MG tablet Take 1 tablet by mouth at bedtime as needed for muscle spasms. 30 tablet 1  . hydrochlorothiazide (HYDRODIURIL) 25 MG tablet Take 1 tablet (25 mg total) by mouth daily. 90 tablet 0  . methocarbamol (ROBAXIN) 500 MG tablet Take 1 tablet (500 mg total) by mouth every 8 (eight) hours as needed for muscle spasms. 30 tablet 0  . naproxen (NAPROSYN) 500 MG tablet Take 1 tablet (500 mg total) by mouth 2 (two) times daily as needed for moderate pain. Take with food. 30 tablet 12  . predniSONE (DELTASONE) 10 MG tablet Take 3 tabs on days 1-2, take 2 tabs on days 3-4, take 1 tab on days 5-6 12 tablet 0   No current facility-administered medications for this visit.     Allergies  Allergen Reactions  . Penicillins Swelling    Swelling of the throat    Family History  Problem Relation Age of Onset  . Hypertension Father   . Hypertension Sister   . Diabetes Maternal Aunt   . Breast cancer Paternal Grandmother 2    Social History   Socioeconomic History  . Marital status: Single    Spouse name: Not on file  . Number of children:  Not on file  . Years of education: Not on file  . Highest education level: Not on file  Occupational History  . Not on file  Social Needs  . Financial resource strain: Not on file  . Food insecurity:    Worry: Not on file    Inability: Not on file  . Transportation needs:    Medical: Not on file    Non-medical: Not on file  Tobacco Use  . Smoking status: Never Smoker  . Smokeless tobacco: Never Used  Substance and Sexual Activity  . Alcohol use: Yes    Comment: occasional  . Drug use: No  . Sexual activity: Yes    Birth control/protection: Surgical  Lifestyle  . Physical activity:    Days per week: Not on file    Minutes per session: Not on file  . Stress: Not on file  Relationships  . Social connections:    Talks on phone: Not on file    Gets together: Not on file    Attends religious service: Not on file    Active member of club or organization: Not on file    Attends meetings of clubs or organizations: Not on file    Relationship status: Not on file  . Intimate  partner violence:    Fear of current or ex partner: Not on file    Emotionally abused: Not on file    Physically abused: Not on file    Forced sexual activity: Not on file  Other Topics Concern  . Not on file  Social History Narrative   Moved from [place].   2 children.  5 grandchildren.   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     Constitutional: Denies fever, malaise, fatigue, headache or abrupt weight changes.   Skin: Pt reports rash. Denies redness, lesions or ulcercations.    No other specific complaints in a complete review of systems (except as listed in HPI above).  Objective:   Physical Exam  BP 132/84   Pulse 81   Temp 98.9 F (37.2 C) (Oral)   Wt 224 lb (101.6 kg)   SpO2 98%   BMI 37.28 kg/m  Wt Readings from Last 3 Encounters:  07/11/18 224 lb (101.6 kg)  07/05/18 226 lb (102.5 kg)  06/09/18 225 lb (102.1 kg)    General: Appears her stated  age, obese, in NAD. Skin: Grouped maculopapular lesions noted on bilateral upper thighs, bilateral forearms, face and back.  BMET    Component Value Date/Time   NA 136 06/09/2018 1130   K 3.4 (L) 06/09/2018 1130   CL 100 06/09/2018 1130   CO2 29 06/09/2018 1130   GLUCOSE 127 (H) 06/09/2018 1130   BUN 13 06/09/2018 1130   CREATININE 1.04 06/09/2018 1130   CREATININE 0.85 11/03/2015 1544   CALCIUM 9.3 06/09/2018 1130   GFRNONAA 58 (L) 01/13/2016 1039   GFRAA >60 01/13/2016 1039    Lipid Panel     Component Value Date/Time   CHOL 224 (H) 06/09/2018 1130   TRIG 164.0 (H) 06/09/2018 1130   HDL 37.40 (L) 06/09/2018 1130   CHOLHDL 6 06/09/2018 1130   VLDL 32.8 06/09/2018 1130   LDLCALC 154 (H) 06/09/2018 1130    CBC    Component Value Date/Time   WBC 8.1 06/09/2018 1130   RBC 4.18 06/09/2018 1130   HGB 13.4 06/09/2018 1130   HCT 39.5 06/09/2018 1130   PLT 356.0 06/09/2018 1130   MCV 94.5 06/09/2018 1130   MCH 31.2 01/13/2016 1039   MCHC 33.9 06/09/2018 1130   RDW 12.9 06/09/2018 1130   LYMPHSABS 2.3 06/09/2018 1130   MONOABS 0.5 06/09/2018 1130   EOSABS 0.1 06/09/2018 1130   BASOSABS 0.0 06/09/2018 1130    Hgb A1C Lab Results  Component Value Date   HGBA1C 5.8 (A) 07/05/2018            Assessment & Plan:   Allergic Dermatitis:  eRx for Clobetasol Cream BID Start Zyrtec 10 mg and Zantac 75 mg daily OTC If no improvement, will refer to allergy  Return precautions discussed Nicki Reaper, NP

## 2018-07-11 NOTE — Patient Instructions (Signed)
Contact Dermatitis Dermatitis is redness, soreness, and swelling (inflammation) of the skin. Contact dermatitis is a reaction to certain substances that touch the skin. You either touched something that irritated your skin, or you have allergies to something you touched. Follow these instructions at home: Skin Care  Moisturize your skin as needed.  Apply cool compresses to the affected areas.  Try taking a bath with: ? Epsom salts. Follow the instructions on the package. You can get these at a pharmacy or grocery store. ? Baking soda. Pour a small amount into the bath as told by your doctor. ? Colloidal oatmeal. Follow the instructions on the package. You can get this at a pharmacy or grocery store.  Try applying baking soda paste to your skin. Stir water into baking soda until it looks like paste.  Do not scratch your skin.  Bathe less often.  Bathe in lukewarm water. Avoid using hot water. Medicines  Take or apply over-the-counter and prescription medicines only as told by your doctor.  If you were prescribed an antibiotic medicine, take or apply your antibiotic as told by your doctor. Do not stop taking the antibiotic even if your condition starts to get better. General instructions  Keep all follow-up visits as told by your doctor. This is important.  Avoid the substance that caused your reaction. If you do not know what caused it, keep a journal to try to track what caused it. Write down: ? What you eat. ? What cosmetic products you use. ? What you drink. ? What you wear in the affected area. This includes jewelry.  If you were given a bandage (dressing), take care of it as told by your doctor. This includes when to change and remove it. Contact a doctor if:  You do not get better with treatment.  Your condition gets worse.  You have signs of infection such as: ? Swelling. ? Tenderness. ? Redness. ? Soreness. ? Warmth.  You have a fever.  You have new  symptoms. Get help right away if:  You have a very bad headache.  You have neck pain.  Your neck is stiff.  You throw up (vomit).  You feel very sleepy.  You see red streaks coming from the affected area.  Your bone or joint underneath the affected area becomes painful after the skin has healed.  The affected area turns darker.  You have trouble breathing. This information is not intended to replace advice given to you by your health care provider. Make sure you discuss any questions you have with your health care provider. Document Released: 08/15/2009 Document Revised: 03/25/2016 Document Reviewed: 03/05/2015 Elsevier Interactive Patient Education  2018 [employer]  

## 2018-07-15 DIAGNOSIS — R21 Rash and other nonspecific skin eruption: Secondary | ICD-10-CM

## 2018-08-11 DIAGNOSIS — G8929 Other chronic pain: Secondary | ICD-10-CM | POA: Diagnosis not present

## 2018-08-11 DIAGNOSIS — M545 Low back pain: Secondary | ICD-10-CM | POA: Diagnosis not present

## 2018-08-11 DIAGNOSIS — M5442 Lumbago with sciatica, left side: Secondary | ICD-10-CM | POA: Diagnosis not present

## 2018-11-16 ENCOUNTER — Encounter: Payer: Self-pay | Admitting: Radiology

## 2019-02-08 DIAGNOSIS — I10 Essential (primary) hypertension: Secondary | ICD-10-CM

## 2019-02-13 MED ORDER — HYDROCHLOROTHIAZIDE 25 MG PO TABS: 25.0000 mg | ORAL_TABLET | Freq: Every day | ORAL | 0 refills | Status: DC

## 2019-02-14 NOTE — Telephone Encounter (Signed)
Chan, can you help? 

## 2019-05-22 ENCOUNTER — Telehealth: Payer: Self-pay

## 2019-05-22 DIAGNOSIS — Z20828 Contact with and (suspected) exposure to other viral communicable diseases: Secondary | ICD-10-CM | POA: Insufficient documentation

## 2019-05-22 DIAGNOSIS — Z20822 Contact with and (suspected) exposure to covid-19: Secondary | ICD-10-CM | POA: Insufficient documentation

## 2019-05-22 NOTE — Telephone Encounter (Signed)
I ordered the test  Please call her with inst re: where to go Thanks

## 2019-05-22 NOTE — Telephone Encounter (Signed)
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 Pt wears a mask at work; all employees at work wears a mask. Pt is not sure she came in contact with employee with + covid because work is not releasing the name of the person that has the covid. Pt has h/a for last couple of days; pts throat is dry and scratchy. No other covid symptoms; no travel; Please advise if can order covid testing. Gentry Fitz NP out of office. Will fwd to Dr Glori Bickers.

## 2019-05-22 NOTE — Telephone Encounter (Signed)
Left VM requesting pt to call the office back, if i'm not available to speak please just give pt address of testing site to go to get Covid-19 testing:  [place] [place] your car under the overhang of the entrance closest to the corner of [address] and [address].

## 2019-05-22 NOTE — Telephone Encounter (Signed)
I gave patient instructions and address.

## 2019-05-23 ENCOUNTER — Other Ambulatory Visit: Payer: Self-pay

## 2019-05-23 DIAGNOSIS — Z20822 Contact with and (suspected) exposure to covid-19: Secondary | ICD-10-CM

## 2019-05-27 LAB — NOVEL CORONAVIRUS, NAA: SARS-CoV-2, NAA: NOT DETECTED

## 2019-05-27 LAB — SPECIMEN STATUS REPORT

## 2019-06-26 ENCOUNTER — Other Ambulatory Visit: Payer: Self-pay

## 2019-06-26 ENCOUNTER — Emergency Department
Admission: EM | Admit: 2019-06-26 | Discharge: 2019-06-26 | Disposition: A | Discharge status: Home / Self Care | Payer: BLUE CROSS/BLUE SHIELD | Attending: Emergency Medicine | Admitting: Emergency Medicine

## 2019-06-26 DIAGNOSIS — M5442 Lumbago with sciatica, left side: Secondary | ICD-10-CM | POA: Insufficient documentation

## 2019-06-26 DIAGNOSIS — Z79899 Other long term (current) drug therapy: Secondary | ICD-10-CM | POA: Insufficient documentation

## 2019-06-26 DIAGNOSIS — I10 Essential (primary) hypertension: Secondary | ICD-10-CM | POA: Insufficient documentation

## 2019-06-26 MED ORDER — PREDNISONE 10 MG PO TABS: ORAL_TABLET | ORAL | 0 refills | Status: DC

## 2019-06-26 MED ORDER — TRAMADOL HCL 50 MG PO TABS: 50.0000 mg | ORAL_TABLET | Freq: Four times a day (QID) | ORAL | 0 refills | Status: DC | PRN

## 2019-06-26 NOTE — Discharge Instructions (Signed)
Follow-up with your primary care provider if any continued problems.  Begin taking medication only as directed.  The tramadol is a pain medication and could cause drowsiness.

## 2019-06-26 NOTE — ED Provider Notes (Signed)
[facility] Emergency Department Provider Note  ____________________________________________   First MD Initiated Contact with Patient 06/26/19 [phone]     (approximate)  I have reviewed the triage vital signs and the nursing notes.   HISTORY  Chief Complaint Back Pain   HPI Barbara Vasquez is a 48 y.o. female presents to the ED with complaint of left-sided low back pain for several days.  Patient states that it moves down her left lower extremity and is similar to the sciatic problems that she has had in the past.  She has taken over-the-counter medication without any relief.  Patient denies any recent injury or urinary symptoms.  She denies any saddle anesthesias or incontinence of bowel or bladder.  She continues to ambulate without any assistance.  Currently she rates her pain as a 10/10.      Past Medical History:  Diagnosis Date  . Essential hypertension   . Heart murmur    as child  . Hypercholesteremia     Patient Active Problem List   Diagnosis Date Noted  . Exposure to Covid-19 Virus 05/22/2019  . Prediabetes 06/27/2017  . Left-sided low back pain with left-sided sciatica 07/28/2016  . Hyperlipidemia 11/12/2015  . Hypertension 11/03/2015    Past Surgical History:  Procedure Laterality Date  . ABDOMINAL HYSTERECTOMY    . CHOLECYSTECTOMY      Prior to Admission medications   Medication Sig Start Date End Date Taking? Authorizing Provider  atorvastatin (LIPITOR) 40 MG tablet Take 1 tablet (40 mg total) by mouth at bedtime. 07/05/18   Doreene Nestlark, Katherine K, NP  hydrochlorothiazide (HYDRODIURIL) 25 MG tablet Take 1 tablet (25 mg total) by mouth daily. For blood pressure. 02/13/19   Doreene Nestlark, Katherine K, NP  predniSONE (DELTASONE) 10 MG tablet Take 6 tablets  today, on day 2 take 5 tablets, day 3 take 4 tablets, day 4 take 3 tablets, day 5 take  2 tablets and 1 tablet the last day 06/26/19   Tommi RumpsSummers, Cordarrel Stiefel L, PA-C  traMADol (ULTRAM) 50 MG tablet  Take 1 tablet (50 mg total) by mouth every 6 (six) hours as needed. 06/26/19   Tommi RumpsSummers, Abran Gavigan L, PA-C    Allergies Penicillins  Family History  Problem Relation Age of Onset  . Hypertension Father   . Hypertension Sister   . Diabetes Maternal Aunt   . Breast cancer Paternal Grandmother [id number]    Social History Social History   Tobacco Use  . Smoking status: Never Smoker  . Smokeless tobacco: Never Used  Substance Use Topics  . Alcohol use: Yes    Comment: occasional  . Drug use: No    Review of Systems Constitutional: No fever/chills Eyes: No visual changes. ENT: No sore throat. Cardiovascular: Denies chest pain. Respiratory: Denies shortness of breath. Genitourinary: Negative for dysuria. Musculoskeletal: Left-sided low back pain with radiation into the left lower extremity. Skin: Negative for rash. Neurological: Negative for headaches, focal weakness or numbness. ____________________________________________   PHYSICAL EXAM:  VITAL SIGNS: ED Triage Vitals  Enc Vitals Group     BP 06/26/19 0857 (!) 149/96     Pulse Rate 06/26/19 0857 78     Resp 06/26/19 0857 17     Temp 06/26/19 0857 99 F (37.2 C)     Temp Source 06/26/19 0857 Oral     SpO2 06/26/19 0857 100 %     Weight 06/26/19 0858 222 lb 10.6 oz (101 kg)     Height 06/26/19 0858 5\' 5"  (1.651 m)  Head Circumference --      Peak Flow --      Pain Score 06/26/19 0858 10     Pain Loc --      Pain Edu? --      Excl. in [place]? --    Constitutional: Alert and oriented. Well appearing and in no acute distress. Eyes: Conjunctivae are normal.  Head: Atraumatic. Neck: No stridor.   Cardiovascular: Normal rate, regular rhythm. Grossly normal heart sounds.  Good peripheral circulation. Respiratory: Normal respiratory effort.  No retractions. Lungs CTAB. Musculoskeletal: On examination of low back there is no gross deformity however there is tenderness on palpation of the left SI joint area.  Good muscle strength  bilaterally.  Straight leg raises were negative.  Patient is able to ambulate without any assistance. Neurologic:  Normal speech and language. No gross focal neurologic deficits are appreciated.  Reflexes are 2+ bilaterally.  No gait instability. Skin:  Skin is warm, dry and intact. No rash noted. Psychiatric: Mood and affect are normal. Speech and behavior are normal.  ____________________________________________   LABS (all labs ordered are listed, but only abnormal results are displayed)  Labs Reviewed - No data to display  PROCEDURES  Procedure(s) performed (including Critical Care):  Procedures   ____________________________________________   INITIAL IMPRESSION / ASSESSMENT AND PLAN / ED COURSE  As part of my medical decision making, I reviewed the following data within the electronic MEDICAL RECORD NUMBER Notes from prior ED visits and [place] Controlled Substance Database  48 year old female presents to the ED with complaint of left sided low back pain which radiates into her left leg.  Patient denies any urinary symptoms or history of injury.  She states she has been taking over-the-counter medicine infrequently without any relief.  Patient was given a prescription for prednisone six-day taper and Tramadol if needed for pain.  Patient is to follow-up with Dr. Rudene Christians who is on-call for orthopedics if any continued problems.  ____________________________________________   FINAL CLINICAL IMPRESSION(S) / ED DIAGNOSES  Final diagnoses:  Acute left-sided low back pain with left-sided sciatica     ED Discharge Orders         Ordered    predniSONE (DELTASONE) 10 MG tablet     06/26/19 0942    traMADol (ULTRAM) 50 MG tablet  Every 6 hours PRN     06/26/19 9147           Note:  This document was prepared using Dragon voice recognition software and may include unintentional dictation errors.    Johnn Hai, PA-C 06/26/19 1122    Harvest Dark, MD 06/26/19 1428

## 2019-06-26 NOTE — ED Triage Notes (Signed)
Pt c/o lower back for the past week that radiates up in to the neck

## 2019-06-26 NOTE — ED Notes (Signed)
See triage note   Presents with left sided back pain for several days  States pain is on left side and moving up back  Denies any injury or urinary sx's  Ambulates well to treatment room

## 2019-07-25 ENCOUNTER — Encounter: Payer: Self-pay | Admitting: Emergency Medicine

## 2019-07-25 ENCOUNTER — Other Ambulatory Visit: Payer: Self-pay

## 2019-07-25 ENCOUNTER — Emergency Department
Admission: EM | Admit: 2019-07-25 | Discharge: 2019-07-25 | Disposition: A | Discharge status: Home / Self Care | Payer: Self-pay | Attending: Emergency Medicine | Admitting: Emergency Medicine

## 2019-07-25 DIAGNOSIS — Y9389 Activity, other specified: Secondary | ICD-10-CM | POA: Insufficient documentation

## 2019-07-25 DIAGNOSIS — Y999 Unspecified external cause status: Secondary | ICD-10-CM | POA: Insufficient documentation

## 2019-07-25 DIAGNOSIS — I10 Essential (primary) hypertension: Secondary | ICD-10-CM | POA: Insufficient documentation

## 2019-07-25 DIAGNOSIS — S39012A Strain of muscle, fascia and tendon of lower back, initial encounter: Secondary | ICD-10-CM | POA: Insufficient documentation

## 2019-07-25 DIAGNOSIS — M5432 Sciatica, left side: Secondary | ICD-10-CM

## 2019-07-25 DIAGNOSIS — Z79899 Other long term (current) drug therapy: Secondary | ICD-10-CM | POA: Insufficient documentation

## 2019-07-25 DIAGNOSIS — X500XXA Overexertion from strenuous movement or load, initial encounter: Secondary | ICD-10-CM | POA: Insufficient documentation

## 2019-07-25 DIAGNOSIS — Y9289 Other specified places as the place of occurrence of the external cause: Secondary | ICD-10-CM | POA: Insufficient documentation

## 2019-07-25 DIAGNOSIS — M5442 Lumbago with sciatica, left side: Secondary | ICD-10-CM | POA: Insufficient documentation

## 2019-07-25 LAB — CBC WITH DIFFERENTIAL/PLATELET
Abs Immature Granulocytes: 0.1 10*3/uL — ABNORMAL HIGH (ref 0.00–0.07)
Basophils Absolute: 0.1 10*3/uL (ref 0.0–0.1)
Basophils Relative: 1 %
Eosinophils Absolute: 0.1 10*3/uL (ref 0.0–0.5)
Eosinophils Relative: 1 %
HCT: 41.8 % (ref 36.0–46.0)
Hemoglobin: 14.1 g/dL (ref 12.0–15.0)
Immature Granulocytes: 1 %
Lymphocytes Relative: 30 %
Lymphs Abs: 2.5 10*3/uL (ref 0.7–4.0)
MCH: 31.7 pg (ref 26.0–34.0)
MCHC: 33.7 g/dL (ref 30.0–36.0)
MCV: 93.9 fL (ref 80.0–100.0)
Monocytes Absolute: 0.6 10*3/uL (ref 0.1–1.0)
Monocytes Relative: 7 %
Neutro Abs: 5.2 10*3/uL (ref 1.7–7.7)
Neutrophils Relative %: 60 %
Platelets: 323 10*3/uL (ref 150–400)
RBC: 4.45 MIL/uL (ref 3.87–5.11)
RDW: 12.2 % (ref 11.5–15.5)
WBC: 8.5 10*3/uL (ref 4.0–10.5)
nRBC: 0 % (ref 0.0–0.2)

## 2019-07-25 LAB — URINALYSIS, COMPLETE (UACMP) WITH MICROSCOPIC
Bacteria, UA: NONE SEEN
Bilirubin Urine: NEGATIVE
Glucose, UA: NEGATIVE mg/dL
Hgb urine dipstick: NEGATIVE
Ketones, ur: NEGATIVE mg/dL
Nitrite: NEGATIVE
Protein, ur: NEGATIVE mg/dL
Specific Gravity, Urine: 1.025 (ref 1.005–1.030)
pH: 5 (ref 5.0–8.0)

## 2019-07-25 LAB — COMPREHENSIVE METABOLIC PANEL
ALT: 18 U/L (ref 0–44)
AST: 19 U/L (ref 15–41)
Albumin: 4.1 g/dL (ref 3.5–5.0)
Alkaline Phosphatase: 68 U/L (ref 38–126)
Anion gap: 9 (ref 5–15)
BUN: 18 mg/dL (ref 6–20)
CO2: 23 mmol/L (ref 22–32)
Calcium: 9.2 mg/dL (ref 8.9–10.3)
Chloride: 103 mmol/L (ref 98–111)
Creatinine, Ser: 0.93 mg/dL (ref 0.44–1.00)
GFR calc Af Amer: >60 mL/min (ref >60)
GFR calc non Af Amer: >60 mL/min (ref >60)
Glucose, Bld: 118 mg/dL — ABNORMAL HIGH (ref 70–99)
Potassium: 3.6 mmol/L (ref 3.5–5.1)
Sodium: 135 mmol/L (ref 135–145)
Total Bilirubin: 0.7 mg/dL (ref 0.3–1.2)
Total Protein: 8.2 g/dL — ABNORMAL HIGH (ref 6.5–8.1)

## 2019-07-25 LAB — MAGNESIUM: Magnesium: 1.9 mg/dL (ref 1.7–2.4)

## 2019-07-25 MED ORDER — KETOROLAC TROMETHAMINE 30 MG/ML IJ SOLN
30.0000 mg | Freq: Once | INTRAMUSCULAR | Status: AC
  Administered 2019-07-25: 30 mg via INTRAMUSCULAR
  Filled 2019-07-25: qty 1

## 2019-07-25 MED ORDER — NAPROXEN 500 MG PO TABS: 500.0000 mg | ORAL_TABLET | Freq: Two times a day (BID) | ORAL | 0 refills | Status: DC

## 2019-07-25 NOTE — ED Triage Notes (Signed)
This RN called Mariella Saa [phone] site manager for [place], advised at this time no WC testing is required until an investigation has been completed. Caryl Pina requested that employee contact her. This RN relayed the request.

## 2019-07-25 NOTE — ED Triage Notes (Signed)
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

## 2019-07-25 NOTE — ED Provider Notes (Signed)
[facility] Emergency Department Provider Note   ____________________________________________   First MD Initiated Contact with Patient 07/25/19 [phone]     (approximate)  I have reviewed the triage vital signs and the nursing notes.   HISTORY  Chief Complaint Back Pain   HPI Barbara Vasquez is a 48 y.o. female presents to the ED with complaint of bilateral low back pain that began Monday at work.  Patient states that she was doing lifting at the time that her back began hurting.  She states she has a history of "disc problems".  Patient states that the right side of her back began improving the next day but she has continued to have left-sided low back pain with left leg radiculopathy.  Patient denies any urinary symptoms, incontinence of bowel or bladder or difficulty walking.  Patient had leftover tramadol at home which she has been taking.  She also reports that her second, third, and fourth digits on her right hand feel strange and that she has some abdominal discomfort with this.  She denies any urinary symptoms or GI difficulties.  There is been no fever, chills, nausea or vomiting.  Patient has continued to be ambulatory without any difficulties.    Past Medical History:  Diagnosis Date  . Essential hypertension   . Heart murmur    as child  . Hypercholesteremia     Patient Active Problem List   Diagnosis Date Noted  . Exposure to Covid-19 Virus 05/22/2019  . Prediabetes 06/27/2017  . Left-sided low back pain with left-sided sciatica 07/28/2016  . Hyperlipidemia 11/12/2015  . Hypertension 11/03/2015    Past Surgical History:  Procedure Laterality Date  . ABDOMINAL HYSTERECTOMY    . CHOLECYSTECTOMY      Prior to Admission medications   Medication Sig Start Date End Date Taking? Authorizing Provider  atorvastatin (LIPITOR) 40 MG tablet Take 1 tablet (40 mg total) by mouth at bedtime. 07/05/18  Yes Doreene Nest, NP   hydrochlorothiazide (HYDRODIURIL) 25 MG tablet Take 1 tablet (25 mg total) by mouth daily. For blood pressure. 02/13/19  Yes Doreene Nest, NP  naproxen (NAPROSYN) 500 MG tablet Take 1 tablet (500 mg total) by mouth 2 (two) times daily with a meal. 07/25/19   Tommi Rumps, PA-C  predniSONE (DELTASONE) 10 MG tablet Take 6 tablets  today, on day 2 take 5 tablets, day 3 take 4 tablets, day 4 take 3 tablets, day 5 take  2 tablets and 1 tablet the last day Patient not taking: Reported on 07/25/2019 06/26/19   Tommi Rumps, PA-C  traMADol (ULTRAM) 50 MG tablet Take 1 tablet (50 mg total) by mouth every 6 (six) hours as needed. Patient not taking: Reported on 07/25/2019 06/26/19   Tommi Rumps, PA-C    Allergies Penicillins  Family History  Problem Relation Age of Onset  . Hypertension Father   . Hypertension Sister   . Diabetes Maternal Aunt   . Breast cancer Paternal Grandmother 71    Social History Social History   Tobacco Use  . Smoking status: Never Smoker  . Smokeless tobacco: Never Used  Substance Use Topics  . Alcohol use: Yes    Comment: occasional  . Drug use: No    Review of Systems Constitutional: No fever/chills Cardiovascular: Denies chest pain. Respiratory: Denies shortness of breath. Gastrointestinal: Positive diffuse abdominal pain.  No nausea, no vomiting.  No diarrhea. Genitourinary: Negative for dysuria. Musculoskeletal: Positive low back pain.  Positive left  leg radiculopathy. Skin: Negative for rash. Neurological: Negative for headaches.  ____________________________________________   PHYSICAL EXAM:  VITAL SIGNS: ED Triage Vitals  Enc Vitals Group     BP 07/25/19 0844 (!) 159/96     Pulse Rate 07/25/19 0844 99     Resp 07/25/19 0844 16     Temp 07/25/19 0844 98.4 F (36.9 C)     Temp Source 07/25/19 0844 Oral     SpO2 07/25/19 0844 97 %     Weight 07/25/19 0839 219 lb (99.3 kg)     Height 07/25/19 0839 5\' 5"  (1.651 m)     Head  Circumference --      Peak Flow --      Pain Score --      Pain Loc --      Pain Edu? --      Excl. in [place]? --    Constitutional: Alert and oriented. Well appearing and in no acute distress. Eyes: Conjunctivae are normal.  Head: Atraumatic. Neck: No stridor.   Cardiovascular: Normal rate, regular rhythm. Grossly normal heart sounds.  Good peripheral circulation. Respiratory: Normal respiratory effort.  No retractions. Lungs CTAB. Gastrointestinal: Soft and nontender. No distention. No CVA tenderness. Musculoskeletal: Examination of the back there is no gross deformity.  There is some generalized tenderness on palpation of the lumbosacral area.  There is no appreciated tenderness on palpation on the right however there is tenderness on palpation of the left SI joint area and surrounding tissue.  Range of motion is without restriction.  Good muscle strength bilaterally.  On examination of the right hand there is no gross deformity and patient is able to move all digits without any difficulty.  There is no restriction with flexion or extension.  Skin is intact.  Capillary refills less than 3 seconds. Neurologic:  Normal speech and language. No gross focal neurologic deficits are appreciated.  Reflexes were 2+ bilaterally.  No gait instability. Skin:  Skin is warm, dry and intact.  Psychiatric: Mood and affect are normal. Speech and behavior are normal.  ____________________________________________   LABS (all labs ordered are listed, but only abnormal results are displayed)  Labs Reviewed  URINALYSIS, COMPLETE (UACMP) WITH MICROSCOPIC - Abnormal; Notable for the following components:      Result Value   Color, Urine YELLOW (*)    APPearance HAZY (*)    Leukocytes,Ua SMALL (*)    All other components within normal limits  COMPREHENSIVE METABOLIC PANEL - Abnormal; Notable for the following components:   Glucose, Bld 118 (*)    Total Protein 8.2 (*)    All other components within normal  limits  CBC WITH DIFFERENTIAL/PLATELET - Abnormal; Notable for the following components:   Abs Immature Granulocytes 0.10 (*)    All other components within normal limits  MAGNESIUM     PROCEDURES  Procedure(s) performed (including Critical Care):  Procedures   ____________________________________________   INITIAL IMPRESSION / ASSESSMENT AND PLAN / ED COURSE  As part of my medical decision making, I reviewed the following data within the electronic MEDICAL RECORD NUMBER Notes from prior ED visits and [place] Controlled Substance Database  Patient presents to the ED with complaint of back injury while at work on Monday.  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  Patient complains of bilateral back pain however the right side began getting better on Tuesday.  She continues to have left low back pain with left leg radiculopathy.  She  also complains of some numbness sensation in her right hand especially the second, third and fourth digits.  She denies any known injury to her hand.  There was also a report of some vague abdominal pain without GI symptoms.  Urinalysis was reassuring.  Lab work was within normal limits.  Patient was given Toradol 30 mg IM while in the ED.  She was told she may continue taking the tramadol that she has at home for pain however she needs to be on anti-inflammatory and naproxen 500 mg twice daily was sent to her pharmacy.  She is encouraged to use ice or heat to her back as needed for discomfort.  She was given a note to return to work.   ____________________________________________   FINAL CLINICAL IMPRESSION(S) / ED DIAGNOSES  Final diagnoses:  Strain of lumbar region, initial encounter  Sciatica of left side     ED Discharge Orders         Ordered    naproxen (NAPROSYN) 500 MG tablet  2 times daily with meals     07/25/19 1028           Note:  This document was prepared using Dragon voice recognition software and may  include unintentional dictation errors.    Tommi Rumps, PA-C 07/25/19 1055    Sharman Cheek, MD 07/27/19 2026

## 2019-07-25 NOTE — ED Notes (Addendum)
Pt reports right sided back injury while at work on Monday, states right side of back began to feel better mid day Tuesday, then began having left sided back pain radiating down left leg. Pt also reports 2nd, 3rd and 4th digit feel numb of right hand, full ROM, warm, cap refill WNL. When sensation tested of right hand, pt states "feels weird". Hx of "disc problems" in back.  Unsure if she wants/eligible to file workers comp.

## 2019-07-25 NOTE — ED Notes (Signed)
Pt has decided not to file workers comp.

## 2019-07-25 NOTE — ED Notes (Signed)
Pt alert and oriented X 4, stable for discharge. RR even and unlabored, color WNL. Discussed discharge instructions and follow up when appropriate. Instructed to follow up with ER for any life threatening symptoms or concerns that patient or family of patient may have  

## 2019-07-25 NOTE — Discharge Instructions (Addendum)
Follow-up with your primary care provider if any continued problems.  Also you may use heat or ice to your back as needed for discomfort.  Begin taking naproxen 500 mg twice daily with food.  This medication will not cause drowsiness and it is okay to drive while taking the medication.  You may also take Tylenol with this medication if any additional pain medication as needed.  If you have any tramadol left over do not take this medication and drive or operate machinery.

## 2019-07-25 NOTE — ED Triage Notes (Signed)
Pt reports she pulled a muscle at work on Monday and she continues to have back pain on the left side that radiates down her leg. Pt reports attempted to go to work yesterday but she couldn't due to back pain. Denies other sx's.

## 2019-12-11 HISTORY — PX: WRIST ARTHROSCOPY: SUR100

## 2020-01-15 ENCOUNTER — Ambulatory Visit: Payer: Self-pay

## 2020-01-15 ENCOUNTER — Other Ambulatory Visit: Payer: Self-pay | Admitting: Emergency Medicine

## 2020-01-15 ENCOUNTER — Ambulatory Visit: Payer: Self-pay | Admitting: Primary Care

## 2020-01-15 ENCOUNTER — Other Ambulatory Visit: Payer: Self-pay | Admitting: Family Medicine

## 2020-01-15 ENCOUNTER — Other Ambulatory Visit: Payer: Self-pay

## 2020-01-15 DIAGNOSIS — M79642 Pain in left hand: Secondary | ICD-10-CM

## 2020-02-02 ENCOUNTER — Ambulatory Visit: Payer: Self-pay | Attending: Internal Medicine

## 2020-02-02 DIAGNOSIS — Z23 Encounter for immunization: Secondary | ICD-10-CM

## 2020-02-02 NOTE — Progress Notes (Signed)
   Covid-19 Vaccination Clinic  Name:  Barbara Vasquez    MRN: [id number] DOB: 09/21/1971  02/02/2020  Barbara Vasquez was observed post Covid-19 immunization for 30 minutes based on pre-vaccination screening without incident. She was provided with Vaccine Information Sheet and instruction to access the V-Safe system.   Barbara Vasquez was instructed to call 911 with any severe reactions post vaccine: Marland Kitchen Difficulty breathing  . Swelling of face and throat  . A fast heartbeat  . A bad rash all over body  . Dizziness and weakness   Immunizations Administered    Name Date Dose VIS Date Route   Pfizer COVID-19 Vaccine 02/02/2020  3:46 PM 0.3 mL 10/12/2019 Intramuscular   Manufacturer: [employer], [employer]   Lot: [id number]   NDC: [address]-9

## 2020-02-04 DIAGNOSIS — M79642 Pain in left hand: Secondary | ICD-10-CM

## 2020-02-04 HISTORY — DX: Pain in left hand: M79.642

## 2020-02-25 ENCOUNTER — Ambulatory Visit: Payer: Self-pay | Attending: Internal Medicine

## 2020-02-25 DIAGNOSIS — Z23 Encounter for immunization: Secondary | ICD-10-CM

## 2020-02-25 NOTE — Progress Notes (Signed)
   Covid-19 Vaccination Clinic  Name:  Barbara Vasquez    MRN: [id number] DOB: Mar 10, 1971  02/25/2020  [name] was observed post Covid-19 immunization for 15 minutes without incident. She was provided with Vaccine Information Sheet and instruction to access the V-Safe system.   [name] was instructed to call 911 with any severe reactions post vaccine: Marland Kitchen Difficulty breathing  . Swelling of face and throat  . A fast heartbeat  . A bad rash all over body  . Dizziness and weakness   Immunizations Administered    Name Date Dose VIS Date Route   Pfizer COVID-19 Vaccine 02/25/2020  3:20 PM 0.3 mL 12/26/2018 Intramuscular   Manufacturer: [employer], [employer]   Lot: [id number]   NDC: [address]-5

## 2020-03-05 DIAGNOSIS — M24132 Other articular cartilage disorders, left wrist: Secondary | ICD-10-CM

## 2020-03-05 DIAGNOSIS — M1812 Unilateral primary osteoarthritis of first carpometacarpal joint, left hand: Secondary | ICD-10-CM | POA: Insufficient documentation

## 2020-03-05 HISTORY — DX: Other articular cartilage disorders, left wrist: M24.132

## 2020-04-01 HISTORY — PX: HAND TENDON SURGERY: SHX663

## 2020-05-06 DIAGNOSIS — S6980XA Other specified injuries of unspecified wrist, hand and finger(s), initial encounter: Secondary | ICD-10-CM | POA: Insufficient documentation

## 2020-05-06 HISTORY — DX: Other specified injuries of unspecified wrist, hand and finger(s), initial encounter: S69.80XA

## 2020-10-09 ENCOUNTER — Encounter: Payer: Self-pay | Admitting: Internal Medicine

## 2020-10-09 ENCOUNTER — Telehealth (INDEPENDENT_AMBULATORY_CARE_PROVIDER_SITE_OTHER): Payer: Self-pay | Admitting: Internal Medicine

## 2020-10-09 DIAGNOSIS — J069 Acute upper respiratory infection, unspecified: Secondary | ICD-10-CM

## 2020-10-09 NOTE — Telephone Encounter (Signed)
Noted, patient evaluated by Nicki Reaper.

## 2020-10-09 NOTE — Telephone Encounter (Signed)
Pt has already sent pt message to Allayne Gitelman NP and pt said she has not had ins and will have ins coverage 11/2020. Pt has had a bad cough and wants cough med which I explained since has not been seen since 2019 would need to do a video visit. Pt wanted to know about cost of visit; pt is not in any distress in breathing per pt. [name] at front desk will talk with pt about cost and scheduling appt. FYI to Allayne Gitelman NP.

## 2020-10-09 NOTE — Progress Notes (Signed)
Virtual Visit via Video Note  I connected with Arrie Eastern on 10/09/20 at 10:15 AM EST by a video enabled telemedicine application and verified that I am speaking with the correct person using two identifiers.  Location: Patient: Home Provider: Office  Person's participating in this video call: Nicki Reaper, NP-C and [employer].   I discussed the limitations of evaluation and management by telemedicine and the availability of in person appointments. The patient expressed understanding and agreed to proceed.  History of Present Illness:  Pt reports headache, nasal congestion, sore throat and body aches. This startedyesterday. The headache is located at the top of her nose. She describes the pain as pressure. She denies dizziness or visual changes. She is not blowing much mucous out of her nose. She denies difficulty swallowing. The cough is mostly nonproductive. She denies runny nose, ear pain, loss of taste/smell or SOB. She denies fever or chills. She has tried Coricidin, Mucinex, Cold and flu medicine with some relief. She has not had sick contacts or exposure to Covid that she is aware of. She has had her covid vaccine.   Past Medical History:  Diagnosis Date  . Essential hypertension   . Heart murmur    as child  . Hypercholesteremia     Current Outpatient Medications  Medication Sig Dispense Refill  . atorvastatin (LIPITOR) 40 MG tablet Take 1 tablet (40 mg total) by mouth at bedtime. 90 tablet 0  . hydrochlorothiazide (HYDRODIURIL) 25 MG tablet Take 1 tablet (25 mg total) by mouth daily. For blood pressure. 90 tablet 0  . naproxen (NAPROSYN) 500 MG tablet Take 1 tablet (500 mg total) by mouth 2 (two) times daily with a meal. 30 tablet 0  . predniSONE (DELTASONE) 10 MG tablet Take 6 tablets  today, on day 2 take 5 tablets, day 3 take 4 tablets, day 4 take 3 tablets, day 5 take  2 tablets and 1 tablet the last day (Patient not taking: Reported on 07/25/2019) 21  tablet 0  . traMADol (ULTRAM) 50 MG tablet Take 1 tablet (50 mg total) by mouth every 6 (six) hours as needed. (Patient not taking: Reported on 07/25/2019) 12 tablet 0   No current facility-administered medications for this visit.    Allergies  Allergen Reactions  . Penicillins Swelling, Hives and Other (See Comments)    Swelling of the throat Swelling of the throat, chest pain    Family History  Problem Relation Age of Onset  . Hypertension Father   . Hypertension Sister   . Diabetes Maternal Aunt   . Breast cancer Paternal Grandmother 40    Social History   Socioeconomic History  . Marital status: Single    Spouse name: Not on file  . Number of children: Not on file  . Years of education: Not on file  . Highest education level: Not on file  Occupational History  . Not on file  Tobacco Use  . Smoking status: Never Smoker  . Smokeless tobacco: Never Used  Substance and Sexual Activity  . Alcohol use: Yes    Comment: occasional  . Drug use: No  . Sexual activity: Yes    Birth control/protection: Surgical  Other Topics Concern  . Not on file  Social History Narrative   Moved from [place].   2 children.  5 grandchildren.   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   Social Determinants of Health   Financial Resource Strain: Not  on file  Food Insecurity: Not on file  Transportation Needs: Not on file  Physical Activity: Not on file  Stress: Not on file  Social Connections: Not on file  Intimate Partner Violence: Not on file     Constitutional: Pt reports headache. Denies fever, malaise, fatigue, or abrupt weight changes.  HEENT: Pt reports nasal congestion and sore throat. Denies eye pain, eye redness, ear pain, ringing in the ears, wax buildup, runny nose, bloody nose Respiratory: Pt reports cough. Denies difficulty breathing, shortness of breath.   Cardiovascular: Denies chest pain, chest tightness, palpitations or swelling in  the hands or feet.  Gastrointestinal: Denies abdominal pain, bloating, constipation, diarrhea or blood in the stool.  Musculoskeletal: Pt reports body aches. Denies decrease in range of motion, difficulty with gait, or joint pain and swelling.   No other specific complaints in a complete review of systems (except as listed in HPI above).  Observations/Objective:   Wt Readings from Last 3 Encounters:  07/25/19 219 lb (99.3 kg)  06/26/19 222 lb 10.6 oz (101 kg)  07/11/18 224 lb (101.6 kg)    General: Appears her stated age, obese in NAD. HEENT: Head: normal shape and size;  Nose: congestion noted; Throat/Mouth: hoarseness noted Neurological: Alert and oriented.   BMET    Component Value Date/Time   NA 135 07/25/2019 0918   K 3.6 07/25/2019 0918   CL 103 07/25/2019 0918   CO2 23 07/25/2019 0918   GLUCOSE 118 (H) 07/25/2019 0918   BUN 18 07/25/2019 0918   CREATININE 0.93 07/25/2019 0918   CREATININE 0.85 11/03/2015 1544   CALCIUM 9.2 07/25/2019 0918   GFRNONAA >60 07/25/2019 0918   GFRAA >60 07/25/2019 0918    Lipid Panel     Component Value Date/Time   CHOL 224 (H) 06/09/2018 1130   TRIG 164.0 (H) 06/09/2018 1130   HDL 37.40 (L) 06/09/2018 1130   CHOLHDL 6 06/09/2018 1130   VLDL 32.8 06/09/2018 1130   LDLCALC 154 (H) 06/09/2018 1130    CBC    Component Value Date/Time   WBC 8.5 07/25/2019 0918   RBC 4.45 07/25/2019 0918   HGB 14.1 07/25/2019 0918   HCT 41.8 07/25/2019 0918   PLT 323 07/25/2019 0918   MCV 93.9 07/25/2019 0918   MCH 31.7 07/25/2019 0918   MCHC 33.7 07/25/2019 0918   RDW 12.2 07/25/2019 0918   LYMPHSABS 2.5 07/25/2019 0918   MONOABS 0.6 07/25/2019 0918   EOSABS 0.1 07/25/2019 0918   BASOSABS 0.1 07/25/2019 0918    Hgb A1C Lab Results  Component Value Date   HGBA1C 5.8 (A) 07/05/2018       Assessment and Plan:  Viral URI with Cough:  Advised her to start Zyrtec and Flonase OTC Can take Delsym as needed for cough Discussed  waiting 3-5 days before getting covid tested Encouraged self quarantine until symptoms resolve- work note provided Encouraged social distancing, masking and frequent handwashing  Return precautions discussed  Follow Up Instructions:    I discussed the assessment and treatment plan with the patient. The patient was provided an opportunity to ask questions and all were answered. The patient agreed with the plan and demonstrated an understanding of the instructions.   The patient was advised to call back or seek an in-person evaluation if the symptoms worsen or if the condition fails to improve as anticipated.    Nicki Reaper, NP

## 2020-10-09 NOTE — Patient Instructions (Signed)

## 2020-10-10 ENCOUNTER — Encounter: Payer: Self-pay | Admitting: Internal Medicine

## 2020-11-19 ENCOUNTER — Encounter: Payer: Self-pay | Admitting: Primary Care

## 2020-11-19 ENCOUNTER — Other Ambulatory Visit: Payer: Self-pay

## 2020-11-19 ENCOUNTER — Ambulatory Visit (INDEPENDENT_AMBULATORY_CARE_PROVIDER_SITE_OTHER): Payer: 59 | Admitting: Primary Care

## 2020-11-19 VITALS — BP 174/100 | HR 98 | Temp 98.6°F | Ht 65.0 in | Wt 232.0 lb

## 2020-11-19 DIAGNOSIS — Z1231 Encounter for screening mammogram for malignant neoplasm of breast: Secondary | ICD-10-CM

## 2020-11-19 DIAGNOSIS — I10 Essential (primary) hypertension: Secondary | ICD-10-CM | POA: Diagnosis not present

## 2020-11-19 DIAGNOSIS — E785 Hyperlipidemia, unspecified: Secondary | ICD-10-CM

## 2020-11-19 DIAGNOSIS — Z1211 Encounter for screening for malignant neoplasm of colon: Secondary | ICD-10-CM | POA: Diagnosis not present

## 2020-11-19 DIAGNOSIS — R7303 Prediabetes: Secondary | ICD-10-CM

## 2020-11-19 DIAGNOSIS — G8929 Other chronic pain: Secondary | ICD-10-CM

## 2020-11-19 DIAGNOSIS — M25532 Pain in left wrist: Secondary | ICD-10-CM

## 2020-11-19 MED ORDER — AMLODIPINE BESYLATE 5 MG PO TABS: 5.0000 mg | ORAL_TABLET | Freq: Every day | ORAL | 0 refills | Status: DC

## 2020-11-19 NOTE — Progress Notes (Signed)
Subjective:    Patient ID: Barbara Vasquez, female    DOB: 1971-02-02, 50 y.o.   MRN: [id number]  HPI  This visit occurred during the SARS-CoV-2 public health emergency.  Safety protocols were in place, including screening questions prior to the visit, additional usage of staff PPE, and extensive cleaning of exam room while observing appropriate contact time as indicated for disinfecting solutions.   Barbara Vasquez is a 50 year old female with a history of hypertension, prediabetes, hyperlipidemia who presents today for follow up.  She is overdue for health maintenance including mammogram and colonoscopy.  1) Essential Hypertension: Previously managed on HCTZ 25 mg. She's not taken this in 6-7 months due to loss of insurance. She denies chest pain. She does notice dizziness and headaches at times.  She did check her blood pressure at work approximately 2 weeks ago and said it was "normal".  She believes her blood pressure was 136/?.  BP Readings from Last 3 Encounters:  11/19/20 (!) 174/100  07/25/19 (!) 142/95  06/26/19 (!) 149/96   2) Hyperlipidemia: Previously managed on atorvastatin 40 mg, has not taken in 6-7 months due to loss of insurance.   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 Following with orthopedics and physical therapy. Has been out of work for the last 1 year, is pending surgery, is waiting to hear back from [employer].   Review of Systems  Eyes: Negative for visual disturbance.  Respiratory: Negative for shortness of breath.   Cardiovascular: Negative for chest pain.  Musculoskeletal: Positive for arthralgias.  Neurological: Positive for light-headedness.       Past Medical History:  Diagnosis Date  . Essential hypertension   . Heart murmur    as child  . Hypercholesteremia      Social History   Socioeconomic History  . Marital status: Single    Spouse name: Not on file  . Number of children: Not on file   . Years of education: Not on file  . Highest education level: Not on file  Occupational History  . Not on file  Tobacco Use  . Smoking status: Never Smoker  . Smokeless tobacco: Never Used  Substance and Sexual Activity  . Alcohol use: Yes    Comment: occasional  . Drug use: No  . Sexual activity: Yes    Birth control/protection: Surgical  Other Topics Concern  . Not on file  Social History Narrative   Moved from [place].   2 children.  5 grandchildren.   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   Social Determinants of Health   Financial Resource Strain: Not on file  Food Insecurity: Not on file  Transportation Needs: Not on file  Physical Activity: Not on file  Stress: Not on file  Social Connections: Not on file  Intimate Partner Violence: Not on file    Past Surgical History:  Procedure Laterality Date  . ABDOMINAL HYSTERECTOMY    . CHOLECYSTECTOMY    . HAND TENDON SURGERY Left 04/01/2020    Family History  Problem Relation Age of Onset  . Hypertension Father   . Hypertension Sister   . Diabetes Maternal Aunt   . Breast cancer Paternal Grandmother 35    Allergies  Allergen Reactions  . Penicillins Swelling, Hives and Other (See Comments)    Swelling of the throat Swelling of the throat, chest pain    Current Outpatient Medications  on File Prior to Visit  Medication Sig Dispense Refill  . naproxen (NAPROSYN) 500 MG tablet Take 1 tablet (500 mg total) by mouth 2 (two) times daily with a meal. 30 tablet 0  . traMADol (ULTRAM) 50 MG tablet Take 1 tablet (50 mg total) by mouth every 6 (six) hours as needed. 12 tablet 0  . atorvastatin (LIPITOR) 40 MG tablet Take 1 tablet (40 mg total) by mouth at bedtime. (Patient not taking: Reported on 11/19/2020) 90 tablet 0  . hydrochlorothiazide (HYDRODIURIL) 25 MG tablet Take 1 tablet (25 mg total) by mouth daily. For blood pressure. (Patient not taking: Reported on 11/19/2020) 90 tablet 0    No current facility-administered medications on file prior to visit.    BP (!) 174/100   Pulse 98   Temp 98.6 F (37 C) (Temporal)   Ht 5\' 5"  (1.651 m)   Wt 232 lb (105.2 kg)   SpO2 98%   BMI 38.61 kg/m    Objective:   Physical Exam Constitutional:      Appearance: She is well-nourished.  Cardiovascular:     Rate and Rhythm: Normal rate and regular rhythm.  Pulmonary:     Effort: Pulmonary effort is normal.     Breath sounds: Normal breath sounds.  Musculoskeletal:     Cervical back: Neck supple.  Skin:    General: Skin is warm and dry.  Psychiatric:        Mood and Affect: Mood and affect normal.            Assessment & Plan:

## 2020-11-19 NOTE — Patient Instructions (Addendum)
You will be contacted regarding your referral to GI for the colonoscopy.  Please let us know if you have not been contacted within two weeks.   Stop by the lab prior to leaving today. I will notify you of your results once received.   Start amlodipine 5 mg for blood pressure. Take 1 tablet by mouth once daily.  Please schedule a follow up visit and physical meet back with me in 2-3 weeks for blood pressure check and physical.   It was a pleasure to see you today!   []   2D Mammogram  [x]   3D Mammogram  []   Bone Density   Call for appointment    Your appointment will at the following location  []   [facility]  [address] [place] [facility]  [phone]  [x]   [facility])   [address]  [place], [facility]  [phone]  []   [facility]      [address] [place], [place]        [address]         []   [facility]  [address] [place], [place]  [place]  []  [facility]   [address]   [place], [facility] [address]  []  [facility]  [address] [place], [address] [phone]    Make sure to wear two peace clothing  No lotions powders or deodorants the day of the appointment Make sure to bring picture ID and insurance card.  Bring list of medications you are currently taking including any supplements.

## 2020-11-19 NOTE — Assessment & Plan Note (Signed)
Repeat A1c pending. 

## 2020-11-19 NOTE — Assessment & Plan Note (Signed)
Following with orthopedics and physical therapy.

## 2020-11-19 NOTE — Assessment & Plan Note (Signed)
Has been out of atorvastatin for the last 6 months.  Repeat lipid panel along with additional labs pending today.  We will send refill if warranted once labs return.

## 2020-11-19 NOTE — Assessment & Plan Note (Signed)
Uncontrolled in the office today, also noted on prior visit from 2020.  Has been out of hydrochlorothiazide for 6 months.  When looking back at labs, it looks like she had a slightly low potassium, for this reason we will refrain from prescribing hydrochlorothiazide at this time.  Prescription for amlodipine 5 mg sent to pharmacy.  We will start with this and plan to see her back in 2 weeks for follow-up.  I have also asked for her to start monitoring her blood pressure between now and her next visit  Labs pending.

## 2020-11-20 ENCOUNTER — Other Ambulatory Visit: Payer: Self-pay | Admitting: Primary Care

## 2020-11-20 DIAGNOSIS — E785 Hyperlipidemia, unspecified: Secondary | ICD-10-CM

## 2020-11-20 LAB — COMPREHENSIVE METABOLIC PANEL
ALT: 19 U/L (ref 0–35)
AST: 20 U/L (ref 0–37)
Albumin: 4.5 g/dL (ref 3.5–5.2)
Alkaline Phosphatase: 89 U/L (ref 39–117)
BUN: 16 mg/dL (ref 6–23)
CO2: 26 mEq/L (ref 19–32)
Calcium: 9.6 mg/dL (ref 8.4–10.5)
Chloride: 104 mEq/L (ref 96–112)
Creatinine, Ser: 1.01 mg/dL (ref 0.40–1.20)
GFR: 64.8 mL/min (ref >60.00)
Glucose, Bld: 88 mg/dL (ref 70–99)
Potassium: 4.1 mEq/L (ref 3.5–5.1)
Sodium: 136 mEq/L (ref 135–145)
Total Bilirubin: 0.4 mg/dL (ref 0.2–1.2)
Total Protein: 8.7 g/dL — ABNORMAL HIGH (ref 6.0–8.3)

## 2020-11-20 LAB — LIPID PANEL
Cholesterol: 266 mg/dL — ABNORMAL HIGH (ref 0–200)
HDL: 37.6 mg/dL — ABNORMAL LOW (ref >39.00)
NonHDL: 228.42
Total CHOL/HDL Ratio: 7
Triglycerides: 253 mg/dL — ABNORMAL HIGH (ref 0.0–149.0)
VLDL: 50.6 mg/dL — ABNORMAL HIGH (ref 0.0–40.0)

## 2020-11-20 LAB — CBC
HCT: 41 % (ref 36.0–46.0)
Hemoglobin: 13.6 g/dL (ref 12.0–15.0)
MCHC: 33.2 g/dL (ref 30.0–36.0)
MCV: 93.1 fl (ref 78.0–100.0)
Platelets: 372 10*3/uL (ref 150.0–400.0)
RBC: 4.41 Mil/uL (ref 3.87–5.11)
RDW: 13.3 % (ref 11.5–15.5)
WBC: 9.2 10*3/uL (ref 4.0–10.5)

## 2020-11-20 LAB — LDL CHOLESTEROL, DIRECT: Direct LDL: 159 mg/dL

## 2020-11-20 LAB — HEMOGLOBIN A1C: Hgb A1c MFr Bld: 6 % (ref 4.6–6.5)

## 2020-11-20 MED ORDER — ATORVASTATIN CALCIUM 40 MG PO TABS: 40.0000 mg | ORAL_TABLET | Freq: Every day | ORAL | 3 refills | Status: DC

## 2020-11-21 ENCOUNTER — Other Ambulatory Visit: Payer: Self-pay

## 2020-11-21 ENCOUNTER — Telehealth (INDEPENDENT_AMBULATORY_CARE_PROVIDER_SITE_OTHER): Payer: Self-pay | Admitting: Gastroenterology

## 2020-11-21 DIAGNOSIS — Z1211 Encounter for screening for malignant neoplasm of colon: Secondary | ICD-10-CM

## 2020-11-21 MED ORDER — NA SULFATE-K SULFATE-MG SULF 17.5-3.13-1.6 GM/177ML PO SOLN: 1.0000 | Freq: Once | ORAL | 0 refills | Status: AC

## 2020-11-21 NOTE — Progress Notes (Signed)
Gastroenterology Pre-Procedure Review  Request Date: Magdalene Molly 02/03 Requesting Physician: Dr. Allegra Lai  PATIENT REVIEW QUESTIONS: The patient responded to the following health history questions as indicated:    1. Are you having any GI issues?Patient states she  experiences bloating and unable to get her stomach down regardless of what she eats. 2. Do you have a personal history of Polyps?  3. Do you have a family history of Colon Cancer or Polyps? no 4. Diabetes Mellitus?no 5. Joint replacements in the past 12 months?no, however she did have hand surgery June/july 2021 6. Major health problems in the past 3 months?no 7. Any artificial heart valves, MVP, or defibrillator?no    MEDICATIONS & ALLERGIES:    Patient reports the following regarding taking any anticoagulation/antiplatelet therapy:   Plavix, Coumadin, Eliquis, Xarelto, Lovenox, Pradaxa, Brilinta, or Effient? no Aspirin?no  Patient confirms/reports the following medications:  Current Outpatient Medications  Medication Sig Dispense Refill  . amLODipine (NORVASC) 5 MG tablet Take 1 tablet (5 mg total) by mouth daily. For blood pressure. 30 tablet 0  . atorvastatin (LIPITOR) 40 MG tablet Take 1 tablet (40 mg total) by mouth at bedtime. For cholesterol 90 tablet 3  . hydrochlorothiazide (HYDRODIURIL) 25 MG tablet Take 1 tablet (25 mg total) by mouth daily. For blood pressure. (Patient not taking: No sig reported) 90 tablet 0  . naproxen (NAPROSYN) 500 MG tablet Take 1 tablet (500 mg total) by mouth 2 (two) times daily with a meal. (Patient not taking: Reported on 11/21/2020) 30 tablet 0  . traMADol (ULTRAM) 50 MG tablet Take 1 tablet (50 mg total) by mouth every 6 (six) hours as needed. (Patient not taking: Reported on 11/21/2020) 12 tablet 0   No current facility-administered medications for this visit.    Patient confirms/reports the following allergies:  Allergies  Allergen Reactions  . Penicillins Swelling, Hives and Other (See  Comments)    Swelling of the throat Swelling of the throat, chest pain    No orders of the defined types were placed in this encounter.   AUTHORIZATION INFORMATION Primary Insurance: 1D#: Group #:  Secondary Insurance: 1D#: Group #:  SCHEDULE INFORMATION: Date: 12/04/20 Time: Location:[facility]

## 2020-11-22 ENCOUNTER — Telehealth: Payer: Self-pay

## 2020-11-22 NOTE — Telephone Encounter (Signed)
Prior auth received for Amlodipine. I have submitted on cover my meds but it is not covered by insurance. No option for covered meds.

## 2020-11-23 NOTE — Telephone Encounter (Signed)
Are you sure? I find it very hard to believe that amlodipine isn't covered! Amlodipine 5 mg is on the [facility] $4 list.

## 2020-11-24 NOTE — Telephone Encounter (Signed)
Double checked want me to call patient and let her know we will call into [facility]  if cost more than that at the pharmacy she is using?

## 2020-11-24 NOTE — Telephone Encounter (Signed)
It looks like I sent it in to [facility] on 11/19/20, right?

## 2020-11-25 NOTE — Telephone Encounter (Signed)
[name], can you add her to my schedule at 1240 for 11/26/2020?

## 2020-11-26 ENCOUNTER — Telehealth (INDEPENDENT_AMBULATORY_CARE_PROVIDER_SITE_OTHER): Payer: 59 | Admitting: Primary Care

## 2020-11-26 ENCOUNTER — Encounter: Payer: Self-pay | Admitting: Primary Care

## 2020-11-26 ENCOUNTER — Other Ambulatory Visit: Payer: Self-pay

## 2020-11-26 DIAGNOSIS — F321 Major depressive disorder, single episode, moderate: Secondary | ICD-10-CM | POA: Diagnosis not present

## 2020-11-26 DIAGNOSIS — F329 Major depressive disorder, single episode, unspecified: Secondary | ICD-10-CM | POA: Insufficient documentation

## 2020-11-26 MED ORDER — SERTRALINE HCL 50 MG PO TABS: 50.0000 mg | ORAL_TABLET | Freq: Every day | ORAL | 1 refills | Status: DC

## 2020-11-26 NOTE — Assessment & Plan Note (Signed)
Nearly year-long history of depression symptoms that likely stems from her accident in March 2021.  PHQ9 SCORE ONLY 11/26/2020 11/19/2020 06/09/2018  PHQ-9 Total Score 16 2 0   We discussed options for treatment, she will look into the free therapy services through her occupation.  We also discussed medication, and she agreed.  Prescription for Zoloft 50 mg sent to pharmacy. Patient is to take 1/2 tablet daily for 8 days, then advance to 1 full tablet thereafter. We discussed possible side effects of headache, GI upset, drowsiness, and SI/HI. If thoughts of SI/HI develop, we discussed to present to the emergency immediately. Patient verbalized understanding.   We will see her back in a few weeks for blood pressure follow-up, we will also follow-up regarding the symptoms.

## 2020-11-26 NOTE — Telephone Encounter (Signed)
Called patient she was able to pick up on formulary

## 2020-11-26 NOTE — Progress Notes (Signed)
Subjective:    Patient ID: Barbara Vasquez, female    DOB: 1971-02-15, 50 y.o.   MRN: [id number]  HPI  Virtual Visit via Video Note  I connected with Barbara Vasquez on 11/26/20 at 12:40 PM EST by a video enabled telemedicine application and verified that I am speaking with the correct person using two identifiers.  Location: Patient: Work Provider: Office Participants: Patient and myself   I discussed the limitations of evaluation and management by telemedicine and the availability of in person appointments. The patient expressed understanding and agreed to proceed.  History of Present Illness:  Barbara Vasquez is a 50 year old female with a history of anxiety and depression, hypertension, hyperlipidemia who presents today to discuss depression.  A few days ago she had a "bad breakdown" and was crying, was not sure why so she contacted [place] via MyChart with this notification.  Given her symptoms we asked her to meet with [place] virtually to discuss further.    Today she endorses a nearly year-long history of symptoms of tearfulness, little motivation to do things, not wanting to go anywhere, feeling down.  She believes the symptoms began shortly after her accident at work in March 2021.   She is currently employed, but not at her prior occupation.  One of her coworkers mentioned a free therapist through a Personnel officer, she plans on contacting them soon.  She was once managed on medication for anxiety during her teenage years, this was very effective.  She does not remember what she took.  She denies SI/HI.   Observations/Objective:  Alert and oriented. Appears well, not sickly. No distress. Speaking in complete sentences.   Assessment and Plan:  Nearly year-long history of depression symptoms that likely stems from her accident in March 2021.  PHQ9 SCORE ONLY 11/26/2020 11/19/2020 06/09/2018  PHQ-9 Total Score 16 2 0   We discussed options for treatment, she will look  into the free therapy services through her occupation.  We also discussed medication, and she agreed.  Prescription for Zoloft 50 mg sent to pharmacy. Patient is to take 1/2 tablet daily for 8 days, then advance to 1 full tablet thereafter. We discussed possible side effects of headache, GI upset, drowsiness, and SI/HI. If thoughts of SI/HI develop, we discussed to present to the emergency immediately. Patient verbalized understanding.   We will see her back in a few weeks for blood pressure follow-up, we will also follow-up regarding the symptoms.   Follow Up Instructions:  Start sertraline (Zoloft) 50 mg for depression. Take 1/2 tablet daily for about one week, then increase to 1 full tablet thereafter.  Please connect with therapy as discussed.  We will see you in a few weeks as scheduled.  It was a pleasure to see you today! Mayra Reel, NP-C    I discussed the assessment and treatment plan with the patient. The patient was provided an opportunity to ask questions and all were answered. The patient agreed with the plan and demonstrated an understanding of the instructions.   The patient was advised to call back or seek an in-person evaluation if the symptoms worsen or if the condition fails to improve as anticipated.    Doreene Nest, NP    Review of Systems  Constitutional: Positive for fatigue.  Psychiatric/Behavioral: Positive for sleep disturbance. The patient is not nervous/anxious.        See HPI       Past Medical History:  Diagnosis Date  . Essential  hypertension   . Heart murmur    as child  . Hypercholesteremia      Social History   Socioeconomic History  . Marital status: Single    Spouse name: Not on file  . Number of children: Not on file  . Years of education: Not on file  . Highest education level: Not on file  Occupational History  . Not on file  Tobacco Use  . Smoking status: Never Smoker  . Smokeless tobacco: Never Used  Substance  and Sexual Activity  . Alcohol use: Yes    Comment: occasional  . Drug use: No  . Sexual activity: Yes    Birth control/protection: Surgical  Other Topics Concern  . Not on file  Social History Narrative   Moved from [place].   2 children.  5 grandchildren.   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   Social Determinants of Health   Financial Resource Strain: Not on file  Food Insecurity: Not on file  Transportation Needs: Not on file  Physical Activity: Not on file  Stress: Not on file  Social Connections: Not on file  Intimate Partner Violence: Not on file    Past Surgical History:  Procedure Laterality Date  . ABDOMINAL HYSTERECTOMY    . CHOLECYSTECTOMY    . HAND TENDON SURGERY Left 04/01/2020    Family History  Problem Relation Age of Onset  . Hypertension Father   . Hypertension Sister   . Diabetes Maternal Aunt   . Breast cancer Paternal Grandmother 56    Allergies  Allergen Reactions  . Penicillins Swelling, Hives and Other (See Comments)    Swelling of the throat Swelling of the throat, chest pain    Current Outpatient Medications on File Prior to Visit  Medication Sig Dispense Refill  . amLODipine (NORVASC) 5 MG tablet Take 1 tablet (5 mg total) by mouth daily. For blood pressure. 30 tablet 0  . atorvastatin (LIPITOR) 40 MG tablet Take 1 tablet (40 mg total) by mouth at bedtime. For cholesterol 90 tablet 3  . hydrochlorothiazide (HYDRODIURIL) 25 MG tablet Take 1 tablet (25 mg total) by mouth daily. For blood pressure. (Patient not taking: Reported on 11/26/2020) 90 tablet 0  . naproxen (NAPROSYN) 500 MG tablet Take 1 tablet (500 mg total) by mouth 2 (two) times daily with a meal. (Patient not taking: No sig reported) 30 tablet 0  . traMADol (ULTRAM) 50 MG tablet Take 1 tablet (50 mg total) by mouth every 6 (six) hours as needed. (Patient not taking: No sig reported) 12 tablet 0   No current facility-administered medications  on file prior to visit.    BP (!) 127/98   Ht 5\' 5"  (1.651 m)   Wt 232 lb (105.2 kg)   BMI 38.61 kg/m    Objective:   Physical Exam Constitutional:      General: She is not in acute distress. Pulmonary:     Effort: Pulmonary effort is normal.  Neurological:     Mental Status: She is alert and oriented to person, place, and time.  Psychiatric:        Mood and Affect: Mood normal.            Assessment & Plan:

## 2020-11-26 NOTE — Patient Instructions (Signed)
Start sertraline (Zoloft) 50 mg for depression. Take 1/2 tablet daily for about one week, then increase to 1 full tablet thereafter.  Please connect with therapy as discussed.  We will see you in a few weeks as scheduled.  It was a pleasure to see you today! Mayra Reel, NP-C

## 2020-12-01 ENCOUNTER — Telehealth: Payer: Self-pay | Admitting: Gastroenterology

## 2020-12-01 DIAGNOSIS — F321 Major depressive disorder, single episode, moderate: Secondary | ICD-10-CM

## 2020-12-01 NOTE — Telephone Encounter (Signed)
Patient had some question about the low fiber diet. Went over these instructions and she verbalized understanding.

## 2020-12-01 NOTE — Telephone Encounter (Signed)
Patient has questions regarding her pre op instructions

## 2020-12-02 ENCOUNTER — Other Ambulatory Visit
Admission: RE | Admit: 2020-12-02 | Discharge: 2020-12-02 | Disposition: A | Discharge status: Home / Self Care | Payer: 59 | Source: Ambulatory Visit | Attending: Gastroenterology | Admitting: Gastroenterology

## 2020-12-02 ENCOUNTER — Other Ambulatory Visit: Payer: Self-pay

## 2020-12-02 DIAGNOSIS — Z01812 Encounter for preprocedural laboratory examination: Secondary | ICD-10-CM | POA: Diagnosis present

## 2020-12-02 DIAGNOSIS — Z20822 Contact with and (suspected) exposure to covid-19: Secondary | ICD-10-CM | POA: Insufficient documentation

## 2020-12-02 LAB — SARS CORONAVIRUS 2 (TAT 6-24 HRS): SARS Coronavirus 2: NEGATIVE

## 2020-12-03 ENCOUNTER — Ambulatory Visit
Admission: RE | Admit: 2020-12-03 | Discharge: 2020-12-03 | Disposition: A | Discharge status: Home / Self Care | Payer: 59 | Source: Ambulatory Visit | Attending: Primary Care | Admitting: Primary Care

## 2020-12-03 DIAGNOSIS — Z1231 Encounter for screening mammogram for malignant neoplasm of breast: Secondary | ICD-10-CM | POA: Insufficient documentation

## 2020-12-04 ENCOUNTER — Ambulatory Visit
Admission: RE | Admit: 2020-12-04 | Discharge: 2020-12-04 | Disposition: A | Discharge status: Home / Self Care | Payer: 59 | Attending: Gastroenterology | Admitting: Gastroenterology

## 2020-12-04 ENCOUNTER — Ambulatory Visit: Payer: 59 | Admitting: Certified Registered Nurse Anesthetist

## 2020-12-04 ENCOUNTER — Other Ambulatory Visit: Payer: Self-pay

## 2020-12-04 ENCOUNTER — Encounter
Admission: RE | Disposition: A | Discharge status: Home / Self Care | Payer: Self-pay | Source: Home / Self Care | Attending: Gastroenterology

## 2020-12-04 ENCOUNTER — Encounter: Payer: Self-pay | Admitting: Gastroenterology

## 2020-12-04 DIAGNOSIS — Z880 Allergy status to penicillin: Secondary | ICD-10-CM | POA: Diagnosis not present

## 2020-12-04 DIAGNOSIS — Z79899 Other long term (current) drug therapy: Secondary | ICD-10-CM | POA: Insufficient documentation

## 2020-12-04 DIAGNOSIS — Z1211 Encounter for screening for malignant neoplasm of colon: Secondary | ICD-10-CM | POA: Diagnosis present

## 2020-12-04 HISTORY — PX: COLONOSCOPY WITH PROPOFOL: SHX5780

## 2020-12-04 SURGERY — COLONOSCOPY WITH PROPOFOL
Anesthesia: General

## 2020-12-04 MED ORDER — PROPOFOL 500 MG/50ML IV EMUL
INTRAVENOUS | Status: AC
  Filled 2020-12-04: qty 50

## 2020-12-04 MED ORDER — LIDOCAINE HCL (PF) 2 % IJ SOLN
INTRAMUSCULAR | Status: AC
  Filled 2020-12-04: qty 5

## 2020-12-04 MED ORDER — PROPOFOL 500 MG/50ML IV EMUL
INTRAVENOUS | Status: DC | PRN
  Administered 2020-12-04: 150 ug/kg/min via INTRAVENOUS

## 2020-12-04 MED ORDER — LIDOCAINE HCL (CARDIAC) PF 100 MG/5ML IV SOSY
PREFILLED_SYRINGE | INTRAVENOUS | Status: DC | PRN
  Administered 2020-12-04: 50 mg via INTRAVENOUS

## 2020-12-04 MED ORDER — PROPOFOL 10 MG/ML IV BOLUS
INTRAVENOUS | Status: DC | PRN
  Administered 2020-12-04: 60 mg via INTRAVENOUS
  Administered 2020-12-04: 40 mg via INTRAVENOUS

## 2020-12-04 MED ORDER — SODIUM CHLORIDE 0.9 % IV SOLN: INTRAVENOUS | Status: DC

## 2020-12-04 NOTE — Anesthesia Postprocedure Evaluation (Signed)
Anesthesia Post Note  Patient: Barbara Vasquez  Procedure(s) Performed: COLONOSCOPY WITH PROPOFOL (N/A )  Patient location during evaluation: Endoscopy Anesthesia Type: General Level of consciousness: awake and alert Pain management: pain level controlled Vital Signs Assessment: post-procedure vital signs reviewed and stable Respiratory status: spontaneous breathing, nonlabored ventilation, respiratory function stable and patient connected to nasal cannula oxygen Cardiovascular status: blood pressure returned to baseline and stable Postop Assessment: no apparent nausea or vomiting Anesthetic complications: no   No complications documented.   Last Vitals:  Vitals:   12/04/20 0900 12/04/20 0910  BP: (!) 131/94 138/90  Pulse: 93 84  Resp: (!) 24 16  Temp:    SpO2: 100% 99%    Last Pain:  Vitals:   12/04/20 0840  TempSrc: Temporal  PainSc:                  [name]

## 2020-12-04 NOTE — Transfer of Care (Signed)
Immediate Anesthesia Transfer of Care Note  Patient: Barbara Vasquez  Procedure(s) Performed: COLONOSCOPY WITH PROPOFOL (N/A )  Patient Location: PACU  Anesthesia Type:General  Level of Consciousness: drowsy  Airway & Oxygen Therapy: Patient Spontanous Breathing  Post-op Assessment: Report given to RN and Post -op Vital signs reviewed and stable  Post vital signs: Reviewed and stable  Last Vitals:  Vitals Value Taken Time  BP 91/50 12/04/20 0844  Temp    Pulse 95 12/04/20 0845  Resp 18 12/04/20 0845  SpO2 95 % 12/04/20 0845  Vitals shown include unvalidated device data.  Last Pain:  Vitals:   12/04/20 0711  TempSrc: Temporal  PainSc: 0-No pain         Complications: No complications documented.

## 2020-12-04 NOTE — Anesthesia Preprocedure Evaluation (Signed)
Anesthesia Evaluation  Patient identified by MRN, date of birth, ID band Patient awake    Reviewed: Allergy & Precautions, H&P , NPO status , Patient's Chart, lab work & pertinent test results, reviewed documented beta blocker date and time   History of Anesthesia Complications Negative for: history of anesthetic complications  Airway Mallampati: IV  TM Distance: >3 FB Neck ROM: full    Dental  (+) Dental Advidsory Given, Missing, Chipped, Teeth Intact   Pulmonary neg pulmonary ROS,    Pulmonary exam normal breath sounds clear to auscultation       Cardiovascular Exercise Tolerance: Good hypertension, (-) CAD, (-) Past MI and (-) Cardiac Stents Normal cardiovascular exam(-) dysrhythmias + Valvular Problems/Murmurs (as a child)  Rhythm:regular Rate:Normal     Neuro/Psych PSYCHIATRIC DISORDERS Depression negative neurological ROS     GI/Hepatic negative GI ROS, Neg liver ROS,   Endo/Other  negative endocrine ROS  Renal/GU negative Renal ROS  negative genitourinary   Musculoskeletal   Abdominal   Peds  Hematology negative hematology ROS (+)   Anesthesia Other Findings Past Medical History: No date: Essential hypertension No date: Heart murmur     Comment:  as child No date: Hypercholesteremia   Reproductive/Obstetrics negative OB ROS                             Anesthesia Physical Anesthesia Plan  ASA: II  Anesthesia Plan: General   Post-op Pain Management:    Induction: Intravenous  PONV Risk Score and Plan: 3 and TIVA and Propofol infusion  Airway Management Planned: Natural Airway and Nasal Cannula  Additional Equipment:   Intra-op Plan:   Post-operative Plan:   Informed Consent: I have reviewed the patients History and Physical, chart, labs and discussed the procedure including the risks, benefits and alternatives for the proposed anesthesia with the patient or  authorized representative who has indicated his/her understanding and acceptance.     Dental Advisory Given  Plan Discussed with: Anesthesiologist, CRNA and Surgeon  Anesthesia Plan Comments:         Anesthesia Quick Evaluation

## 2020-12-04 NOTE — H&P (Signed)
Arlyss Repress, MD [address]  [address]  [place], [place] [address]  Main: [phone]  Fax: [phone] Pager: [phone]  Primary Care Physician:  Doreene Nest, NP Primary Gastroenterologist:  Dr. Arlyss Repress  Pre-Procedure History & Physical: HPI:  Barbara Vasquez is a 50 y.o. female is here for an colonoscopy.   Past Medical History:  Diagnosis Date  . Essential hypertension   . Heart murmur    as child  . Hypercholesteremia     Past Surgical History:  Procedure Laterality Date  . ABDOMINAL HYSTERECTOMY    . CHOLECYSTECTOMY    . HAND TENDON SURGERY Left 04/01/2020    Prior to Admission medications   Medication Sig Start Date End Date Taking? Authorizing Provider  amLODipine (NORVASC) 5 MG tablet Take 1 tablet (5 mg total) by mouth daily. For blood pressure. 11/19/20  Yes Doreene Nest, NP  atorvastatin (LIPITOR) 40 MG tablet Take 1 tablet (40 mg total) by mouth at bedtime. For cholesterol 11/20/20  Yes Doreene Nest, NP  sertraline (ZOLOFT) 50 MG tablet Take 1 tablet (50 mg total) by mouth daily. For depression. 11/26/20  Yes Doreene Nest, NP  hydrochlorothiazide (HYDRODIURIL) 25 MG tablet Take 1 tablet (25 mg total) by mouth daily. For blood pressure. Patient not taking: No sig reported 02/13/19   Doreene Nest, NP  naproxen (NAPROSYN) 500 MG tablet Take 1 tablet (500 mg total) by mouth 2 (two) times daily with a meal. Patient not taking: No sig reported 07/25/19   Tommi Rumps, PA-C  traMADol (ULTRAM) 50 MG tablet Take 1 tablet (50 mg total) by mouth every 6 (six) hours as needed. Patient not taking: No sig reported 06/26/19   Bridget Hartshorn L, PA-C    Allergies as of 11/21/2020 - Review Complete 11/19/2020  Allergen Reaction Noted  . Penicillins Swelling, Hives, and Other (See Comments) 11/03/2015    Family History  Problem Relation Age of Onset  . Hypertension Father   . Hypertension Sister   . Diabetes Maternal  Aunt   . Breast cancer Paternal Grandmother 45    Social History   Socioeconomic History  . Marital status: Single    Spouse name: Not on file  . Number of children: Not on file  . Years of education: Not on file  . Highest education level: Not on file  Occupational History  . Not on file  Tobacco Use  . Smoking status: Never Smoker  . Smokeless tobacco: Never Used  Vaping Use  . Vaping Use: Never used  Substance and Sexual Activity  . Alcohol use: Yes    Comment: occasional  . Drug use: No  . Sexual activity: Yes    Birth control/protection: Surgical  Other Topics Concern  . Not on file  Social History Narrative   Moved from [place].   2 children.  5 grandchildren.   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   Social Determinants of Health   Financial Resource Strain: Not on file  Food Insecurity: Not on file  Transportation Needs: Not on file  Physical Activity: Not on file  Stress: Not on file  Social Connections: Not on file  Intimate Partner Violence: Not on file    Review of Systems: See HPI, otherwise negative ROS  Physical Exam: BP (!) 156/103   Pulse 91   Temp (!) 97.5 F (36.4 C) (Temporal)   Resp 20   Ht  5\' 5"  (1.651 m)   Wt 102.5 kg   SpO2 99%   BMI 37.61 kg/m  General:   Alert,  pleasant and cooperative in NAD Head:  Normocephalic and atraumatic. Neck:  Supple; no masses or thyromegaly. Lungs:  Clear throughout to auscultation.    Heart:  Regular rate and rhythm. Abdomen:  Soft, nontender and nondistended. Normal bowel sounds, without guarding, and without rebound.   Neurologic:  Alert and  oriented x4;  grossly normal neurologically.  Impression/Plan: Barbara Vasquez is here for an colonoscopy to be performed for colon cancer screening  Risks, benefits, limitations, and alternatives regarding  colonoscopy have been reviewed with the patient.  Questions have been answered.  All parties  agreeable.   Arrie Eastern, MD  12/04/2020, 8:24 AM

## 2020-12-04 NOTE — Op Note (Signed)
[facility] Patient Name: Barbara Vasquez Procedure Date: 12/04/2020 8:21 AM MRN: [id number] Account #: [id number] Date of Birth: 07/16/1971 Admit Type: Outpatient Age: 50 Room: [facility] ENDO ROOM 4 Gender: Female Note Status: Finalized Procedure:             Colonoscopy Indications:           Screening for colorectal malignant neoplasm Providers:             Toney Reil MD, MD Referring MD:          Doreene Nest (Referring MD) Medicines:             General Anesthesia Complications:         No immediate complications. Estimated blood loss: None. Procedure:             Pre-Anesthesia Assessment:                        - Prior to the procedure, a History and Physical was                         performed, and patient medications and allergies were                         reviewed. The patient is competent. The risks and                         benefits of the procedure and the sedation options and                         risks were discussed with the patient. All questions                         were answered and informed consent was obtained.                         Patient identification and proposed procedure were                         verified by the physician, the nurse, the                         anesthesiologist, the anesthetist and the technician                         in the pre-procedure area in the procedure room in the                         endoscopy suite. Mental Status Examination: alert and                         oriented. Airway Examination: normal oropharyngeal                         airway and neck mobility. Respiratory Examination:                         clear to auscultation. CV Examination: normal.  Prophylactic Antibiotics: The patient does not require                         prophylactic antibiotics. Prior Anticoagulants: The                         patient has taken no previous  anticoagulant or                         antiplatelet agents. ASA Grade Assessment: II - A                         patient with mild systemic disease. After reviewing                         the risks and benefits, the patient was deemed in                         satisfactory condition to undergo the procedure. The                         anesthesia plan was to use general anesthesia.                         Immediately prior to administration of medications,                         the patient was re-assessed for adequacy to receive                         sedatives. The heart rate, respiratory rate, oxygen                         saturations, blood pressure, adequacy of pulmonary                         ventilation, and response to care were monitored                         throughout the procedure. The physical status of the                         patient was re-assessed after the procedure.                        After obtaining informed consent, the colonoscope was                         passed under direct vision. Throughout the procedure,                         the patient's blood pressure, pulse, and oxygen                         saturations were monitored continuously. The                         Colonoscope was introduced through the anus and  advanced to the the cecum, identified by appendiceal                         orifice and ileocecal valve. The colonoscopy was                         performed without difficulty. The patient tolerated                         the procedure well. The quality of the bowel                         preparation was evaluated using the BBPS [facility] Bowel                         Preparation Scale) with scores of: Right Colon = 3,                         Transverse Colon = 3 and Left Colon = 3 (entire mucosa                         seen well with no residual staining, small fragments                         of stool or  opaque liquid). The total BBPS score                         equals 9. Findings:      The perianal and digital rectal examinations were normal. Pertinent       negatives include normal sphincter tone and no palpable rectal lesions.      The entire examined colon appeared normal.      The retroflexed view of the distal rectum and anal verge was normal and       showed no anal or rectal abnormalities. Impression:            - The entire examined colon is normal.                        - The distal rectum and anal verge are normal on                         retroflexion view.                        - No specimens collected. Recommendation:        - Discharge patient to home (with escort).                        - Resume previous diet today.                        - Continue present medications.                        - Repeat colonoscopy in 10 years for screening                         purposes. Procedure Code(s):     ---  Professional ---                        C1448, Colorectal cancer screening; colonoscopy on                         individual not meeting criteria for high risk Diagnosis Code(s):     --- Professional ---                        Z12.11, Encounter for screening for malignant neoplasm                         of colon CPT copyright 2019 American Medical Association. All rights reserved. The codes documented in this report are preliminary and upon coder review may  be revised to meet current compliance requirements. Dr. Libby Maw Toney Reil MD, MD 12/04/2020 8:42:18 AM This report has been signed electronically. Number of Addenda: 0 Note Initiated On: 12/04/2020 8:21 AM Scope Withdrawal Time: 0 hours 5 minutes 29 seconds  Total Procedure Duration: 0 hours 10 minutes 39 seconds  Estimated Blood Loss:  Estimated blood loss: none.      [facility]

## 2020-12-05 ENCOUNTER — Encounter: Payer: Self-pay | Admitting: Gastroenterology

## 2020-12-08 ENCOUNTER — Other Ambulatory Visit: Payer: Self-pay

## 2020-12-08 ENCOUNTER — Encounter: Payer: Self-pay | Admitting: Primary Care

## 2020-12-08 ENCOUNTER — Ambulatory Visit (INDEPENDENT_AMBULATORY_CARE_PROVIDER_SITE_OTHER): Payer: 59 | Admitting: Primary Care

## 2020-12-08 VITALS — BP 155/96 | HR 99 | Temp 98.7°F | Ht 65.0 in | Wt 230.0 lb

## 2020-12-08 DIAGNOSIS — E785 Hyperlipidemia, unspecified: Secondary | ICD-10-CM

## 2020-12-08 DIAGNOSIS — F321 Major depressive disorder, single episode, moderate: Secondary | ICD-10-CM

## 2020-12-08 DIAGNOSIS — M25532 Pain in left wrist: Secondary | ICD-10-CM | POA: Diagnosis not present

## 2020-12-08 DIAGNOSIS — Z0000 Encounter for general adult medical examination without abnormal findings: Secondary | ICD-10-CM | POA: Diagnosis not present

## 2020-12-08 DIAGNOSIS — I10 Essential (primary) hypertension: Secondary | ICD-10-CM | POA: Diagnosis not present

## 2020-12-08 DIAGNOSIS — R7303 Prediabetes: Secondary | ICD-10-CM

## 2020-12-08 DIAGNOSIS — G8929 Other chronic pain: Secondary | ICD-10-CM

## 2020-12-08 DIAGNOSIS — Z0001 Encounter for general adult medical examination with abnormal findings: Secondary | ICD-10-CM | POA: Insufficient documentation

## 2020-12-08 MED ORDER — AMLODIPINE BESYLATE 10 MG PO TABS: 10.0000 mg | ORAL_TABLET | Freq: Every day | ORAL | 3 refills | Status: DC

## 2020-12-08 NOTE — Assessment & Plan Note (Signed)
Recent LDL of 159. She is working on dietary improvements, plans on beginning an exercise regimen.  Continue to monitor.

## 2020-12-08 NOTE — Assessment & Plan Note (Signed)
Will undergo surgical intervention this week. Following with Work[employer]'s [employer].

## 2020-12-08 NOTE — Assessment & Plan Note (Signed)
On Zoloft at 25 mg x 5 days, has noticed some improvement. No negative effects.  Discussed to increase to 50 mg when ready. She will update.

## 2020-12-08 NOTE — Progress Notes (Signed)
Subjective:    Patient ID: Barbara Vasquez, female    DOB: 23-Jul-1971, 50 y.o.   MRN: [id number]  HPI  This visit occurred during the SARS-CoV-2 public health emergency.  Safety protocols were in place, including screening questions prior to the visit, additional usage of staff PPE, and extensive cleaning of exam room while observing appropriate contact time as indicated for disinfecting solutions.   Barbara Vasquez is a 50 year old female who presents today for complete physical.  She is checking her BP at home which is running 150's-170's/90's-100's.  She has been taking sertraline 25 mg for about one week, has not had any problems. Would like to continue at 25 mg for now, she plans on increasing in a week or so.   Immunizations: -Tetanus: 2014 -Influenza: Declines  -Shingles: Never Completed -Covid-19: Completed 3 vaccines  Diet: She endorses a fair diet, is cooking most of her meals at home.  Exercise: She is not exercising regularly.   Eye exam: Completed several years ago.  Dental exam: Scheduled for 2 weeks  Pap Smear: Hysterectomy, 2 ovaries remain  Mammogram: February 2022 Colonoscopy: February 2022, due 2032 Hep C Screen:  BP Readings from Last 3 Encounters:  12/08/20 (!) 155/96  12/04/20 138/90  11/26/20 (!) 127/98      Review of Systems  Constitutional: Negative for unexpected weight change.  HENT: Negative for rhinorrhea.   Eyes: Negative for visual disturbance.  Respiratory: Negative for cough and shortness of breath.   Cardiovascular: Negative for chest pain.  Gastrointestinal: Negative for constipation and diarrhea.  Genitourinary: Negative for difficulty urinating.  Musculoskeletal: Positive for arthralgias.  Skin: Negative for rash.  Allergic/Immunologic: Negative for environmental allergies.  Neurological: Negative for dizziness, numbness and headaches.  Psychiatric/Behavioral:       Started sertraline 25 mg five days ago, has noticed less  tearfulness.      Past Medical History:  Diagnosis Date  . Essential hypertension   . Heart murmur    as child  . Hypercholesteremia      Social History   Socioeconomic History  . Marital status: Single    Spouse name: Not on file  . Number of children: Not on file  . Years of education: Not on file  . Highest education level: Not on file  Occupational History  . Not on file  Tobacco Use  . Smoking status: Never Smoker  . Smokeless tobacco: Never Used  Vaping Use  . Vaping Use: Never used  Substance and Sexual Activity  . Alcohol use: Yes    Comment: occasional  . Drug use: No  . Sexual activity: Yes    Birth control/protection: Surgical  Other Topics Concern  . Not on file  Social History Narrative   Moved from [place].   2 children.  5 grandchildren.   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   Social Determinants of Health   Financial Resource Strain: Not on file  Food Insecurity: Not on file  Transportation Needs: Not on file  Physical Activity: Not on file  Stress: Not on file  Social Connections: Not on file  Intimate Partner Violence: Not on file    Past Surgical History:  Procedure Laterality Date  . ABDOMINAL HYSTERECTOMY    . CHOLECYSTECTOMY    . COLONOSCOPY WITH PROPOFOL N/A 12/04/2020   Procedure: COLONOSCOPY WITH PROPOFOL;  Surgeon: Toney Reil, MD;  Location: [facility] ENDOSCOPY;  Service: Gastroenterology;  Laterality: N/A;  .  HAND TENDON SURGERY Left 04/01/2020    Family History  Problem Relation Age of Onset  . Hypertension Father   . Hypertension Sister   . Diabetes Maternal Aunt   . Breast cancer Paternal Grandmother 4    Allergies  Allergen Reactions  . Penicillins Swelling, Hives and Other (See Comments)    Swelling of the throat Swelling of the throat, chest pain    Current Outpatient Medications on File Prior to Visit  Medication Sig Dispense Refill  . atorvastatin (LIPITOR) 40 MG  tablet Take 1 tablet (40 mg total) by mouth at bedtime. For cholesterol 90 tablet 3  . sertraline (ZOLOFT) 50 MG tablet Take 1 tablet (50 mg total) by mouth daily. For depression. 30 tablet 1   No current facility-administered medications on file prior to visit.    BP (!) 155/96   Pulse 99   Temp 98.7 F (37.1 C) (Temporal)   Ht 5\' 5"  (1.651 m)   Wt 230 lb (104.3 kg)   SpO2 97%   BMI 38.27 kg/m    Objective:   Physical Exam Constitutional:      Appearance: She is well-nourished.  HENT:     Right Ear: Tympanic membrane and ear canal normal.     Left Ear: Tympanic membrane and ear canal normal.     Mouth/Throat:     Mouth: Oropharynx is clear and moist.  Eyes:     Extraocular Movements: EOM normal.     Pupils: Pupils are equal, round, and reactive to light.  Cardiovascular:     Rate and Rhythm: Normal rate and regular rhythm.  Pulmonary:     Effort: Pulmonary effort is normal.     Breath sounds: Normal breath sounds.  Abdominal:     General: Bowel sounds are normal.     Palpations: Abdomen is soft.     Tenderness: There is no abdominal tenderness.  Musculoskeletal:        General: Normal range of motion.     Cervical back: Neck supple.  Skin:    General: Skin is warm and dry.  Neurological:     Mental Status: She is alert and oriented to person, place, and time.     Cranial Nerves: No cranial nerve deficit.     Deep Tendon Reflexes:     Reflex Scores:      Patellar reflexes are 2+ on the right side and 2+ on the left side. Psychiatric:        Mood and Affect: Mood and affect and mood normal.            Assessment & Plan:

## 2020-12-08 NOTE — Assessment & Plan Note (Signed)
A1C of 6.0 on recent labs.  Discussed the importance of a healthy diet and regular exercise in order for weight loss, and to reduce the risk of any potential medical problems.

## 2020-12-08 NOTE — Assessment & Plan Note (Signed)
Above goal in the office today, improved some on recheck. History of low potassium on HCTZ.  Increase amlodipine to 10 mg. We will see her back in 2-3 weeks for BP check.

## 2020-12-08 NOTE — Patient Instructions (Signed)
We increased your amlodipine blood pressure medication to 10 mg. I sent a new prescription to your pharmacy.  Continue to watch your blood pressure.  Please schedule a follow up visit to meet back with me in 2-3 weeks for blood pressure check.   It was a pleasure to see you today!

## 2020-12-08 NOTE — Assessment & Plan Note (Signed)
Declines influenza vaccine and shingles vaccines. Mammogram UTD. Colonoscopy UTD due in 2032.  Discussed the importance of a healthy diet and regular exercise in order for weight loss, and to reduce the risk of any potential medical problems.  Exam today stable. Labs reviewed.

## 2020-12-19 ENCOUNTER — Ambulatory Visit: Payer: 59 | Admitting: Psychology

## 2020-12-19 ENCOUNTER — Ambulatory Visit (INDEPENDENT_AMBULATORY_CARE_PROVIDER_SITE_OTHER): Payer: 59 | Admitting: Psychology

## 2020-12-19 DIAGNOSIS — F331 Major depressive disorder, recurrent, moderate: Secondary | ICD-10-CM | POA: Diagnosis not present

## 2020-12-21 NOTE — Telephone Encounter (Signed)
Is she asking for another referral? Can you take a look?

## 2020-12-24 ENCOUNTER — Ambulatory Visit (INDEPENDENT_AMBULATORY_CARE_PROVIDER_SITE_OTHER): Payer: 59 | Admitting: Primary Care

## 2020-12-24 ENCOUNTER — Other Ambulatory Visit: Payer: Self-pay

## 2020-12-24 ENCOUNTER — Encounter: Payer: Self-pay | Admitting: Primary Care

## 2020-12-24 DIAGNOSIS — I10 Essential (primary) hypertension: Secondary | ICD-10-CM | POA: Diagnosis not present

## 2020-12-24 DIAGNOSIS — F321 Major depressive disorder, single episode, moderate: Secondary | ICD-10-CM

## 2020-12-24 NOTE — Patient Instructions (Signed)
Continue taking amlodipine 10 mg once daily for blood pressure.  Keep me updated regarding the Zoloft.  It was a pleasure to see you today!

## 2020-12-24 NOTE — Assessment & Plan Note (Signed)
Improving on sertraline 50 mg, is taking 25 mg at the moment. She will update.

## 2020-12-24 NOTE — Assessment & Plan Note (Signed)
Improved on amlodipine 10 mg daily, continue same. Also discussed to work on weight loss through diet and exercise.

## 2020-12-24 NOTE — Progress Notes (Signed)
Subjective:    Patient ID: Barbara Vasquez, female    DOB: Aug 01, 1971, 50 y.o.   MRN: [id number]  HPI  This visit occurred during the SARS-CoV-2 public health emergency.  Safety protocols were in place, including screening questions prior to the visit, additional usage of staff PPE, and extensive cleaning of exam room while observing appropriate contact time as indicated for disinfecting solutions.   Barbara Vasquez is a 50 year old female with a history of hypertension, hyperlipidemia, prediabetes, depression who presents today for follow-up of hypertension.  She was last evaluated on 12/08/2020 for general follow-up/physical, blood pressure was above goal so we decided to increase her amlodipine to 10 mg and see her back for follow-up.  Since her last visit she's compliant to her amlodipine 10 mg daily. She denies ankle edema, chest pain, headaches.   BP Readings from Last 3 Encounters:  12/24/20 130/80  12/08/20 (!) 155/96  12/04/20 138/90     Review of Systems  Eyes: Negative for visual disturbance.  Respiratory: Negative for shortness of breath.   Cardiovascular: Negative for chest pain.  Neurological: Negative for dizziness and headaches.       Past Medical History:  Diagnosis Date  . Essential hypertension   . Heart murmur    as child  . Hypercholesteremia      Social History   Socioeconomic History  . Marital status: Single    Spouse name: Not on file  . Number of children: Not on file  . Years of education: Not on file  . Highest education level: Not on file  Occupational History  . Not on file  Tobacco Use  . Smoking status: Never Smoker  . Smokeless tobacco: Never Used  Vaping Use  . Vaping Use: Never used  Substance and Sexual Activity  . Alcohol use: Yes    Comment: occasional  . Drug use: No  . Sexual activity: Yes    Birth control/protection: Surgical  Other Topics Concern  . Not on file  Social History Narrative   Moved from [place].   2  children.  5 grandchildren.   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   Social Determinants of Health   Financial Resource Strain: Not on file  Food Insecurity: Not on file  Transportation Needs: Not on file  Physical Activity: Not on file  Stress: Not on file  Social Connections: Not on file  Intimate Partner Violence: Not on file    Past Surgical History:  Procedure Laterality Date  . ABDOMINAL HYSTERECTOMY    . CHOLECYSTECTOMY    . COLONOSCOPY WITH PROPOFOL N/A 12/04/2020   Procedure: COLONOSCOPY WITH PROPOFOL;  Surgeon: Toney Reil, MD;  Location: [facility] ENDOSCOPY;  Service: Gastroenterology;  Laterality: N/A;  . HAND TENDON SURGERY Left 04/01/2020    Family History  Problem Relation Age of Onset  . Hypertension Father   . Hypertension Sister   . Diabetes Maternal Aunt   . Breast cancer Paternal Grandmother 69    Allergies  Allergen Reactions  . Penicillins Swelling, Hives and Other (See Comments)    Swelling of the throat Swelling of the throat, chest pain    Current Outpatient Medications on File Prior to Visit  Medication Sig Dispense Refill  . amLODipine (NORVASC) 10 MG tablet Take 1 tablet (10 mg total) by mouth daily. For blood pressure. 90 tablet 3  . atorvastatin (LIPITOR) 40 MG tablet Take 1 tablet (40 mg total) by  mouth at bedtime. For cholesterol 90 tablet 3  . sertraline (ZOLOFT) 50 MG tablet Take 1 tablet (50 mg total) by mouth daily. For depression. 30 tablet 1  . methocarbamol (ROBAXIN) 500 MG tablet Take 500 mg by mouth 4 (four) times daily as needed.     No current facility-administered medications on file prior to visit.    BP 130/80   Pulse 96   Temp (!) 97.2 F (36.2 C) (Temporal)   Ht 5\' 5"  (1.651 m)   Wt 230 lb (104.3 kg)   SpO2 97%   BMI 38.27 kg/m    Objective:   Physical Exam Constitutional:      Appearance: She is well-nourished.  Cardiovascular:     Rate and Rhythm: Normal rate and  regular rhythm.  Pulmonary:     Effort: Pulmonary effort is normal.     Breath sounds: Normal breath sounds.  Musculoskeletal:     Cervical back: Neck supple.  Skin:    General: Skin is warm and dry.  Psychiatric:        Mood and Affect: Mood and affect normal.            Assessment & Plan:

## 2020-12-25 ENCOUNTER — Ambulatory Visit (INDEPENDENT_AMBULATORY_CARE_PROVIDER_SITE_OTHER): Payer: 59 | Admitting: Psychology

## 2020-12-25 DIAGNOSIS — F331 Major depressive disorder, recurrent, moderate: Secondary | ICD-10-CM | POA: Diagnosis not present

## 2020-12-31 ENCOUNTER — Ambulatory Visit: Payer: 59 | Admitting: Psychology

## 2020-12-31 NOTE — Telephone Encounter (Signed)
[facility] Day - Client TELEPHONE ADVICE RECORD Access[employer]urse Patient Name: Barbara Vasquez ER Gender: Female DOB: May 04, 1971 Age: 50 Y 10 M 5 D Return Phone Number: [phone] (Primary) Address: City/State/Zip: [place] [address] Client [facility] Day - Client Client Site [facility] - Day Physician Vernona Rieger - NP Contact Type Call Who Is Calling Patient / Member / Family / Caregiver Call Type Triage / Clinical Relationship To Patient Self Return Phone Number [phone] (Primary) Chief Complaint Diarrhea Reason for Call Symptomatic / Request for Health Information Initial Comment Caller states she has been experiencing diarrhea since yesterday morning. Translation No Nurse Assessment Nurse: Yetta Barre, RN, Miranda Date/Time (Eastern Time): 12/31/2020 1:48:05 PM Confirm and document reason for call. If symptomatic, describe symptoms. ---Caller states she has been having diarrhea since yesterday. Does the patient have any new or worsening symptoms? ---Yes Will a triage be completed? ---Yes Related visit to physician within the last 2 weeks? ---No Does the PT have any chronic conditions? (i.e. diabetes, asthma, this includes High risk factors for pregnancy, etc.) ---No Is the patient pregnant or possibly pregnant? (Ask all females between the ages of 37-55) ---No Is this a behavioral health or substance abuse call? ---No Guidelines Guideline Title Affirmed Question Affirmed Notes Nurse Date/Time (Eastern Time) Diarrhea [1] Constant abdominal pain AND [2] present > 2 hours Yetta Barre, RN, Miranda 12/31/2020 1:48:40 PM Disp. Time Lamount Cohen Time) Disposition Final User 12/31/2020 1:54:12 PM See HCP within 4 Hours (or PCP triage) Yes Yetta Barre, RN, Miranda Caller Disagree/Comply Comply Caller Understands Yes PLEASE NOTE: All timestamps contained within this report are represented as [place] Standard  Time. CONFIDENTIALTY NOTICE: This fax transmission is intended only for the addressee. It contains information that is legally privileged, confidential or otherwise protected from use or disclosure. If you are not the intended recipient, you are strictly prohibited from reviewing, disclosing, copying using or disseminating any of this information or taking any action in reliance on or regarding this information. If you have received this fax in error, please notify us immediately by telephone so that we can arrange for its return to [place]. Phone: [phone], Toll-Free: [phone], Fax: [phone] Page: 2 of 2 Call Id: [id number] PreDisposition Did not know what to do Care Advice Given Per Guideline SEE HCP (OR PCP TRIAGE) WITHIN 4 HOURS: * IF OFFICE WILL BE OPEN: You need to be seen within the next 3 or 4 hours. Call your doctor (or NP/PA) now or as soon as the office opens. CALL BACK IF: * You become worse CARE ADVICE given per Diarrhea (Adult) guideline. Comments User: Grier Rocher, RN Date/Time Lamount Cohen Time): 12/31/2020 2:02:25 PM NO appt available. Pt refused UC Referrals REFERRED TO PCP OFFICE

## 2020-12-31 NOTE — Telephone Encounter (Signed)
Appointment scheduled for tomorrow (3/3) at 3:20.

## 2020-12-31 NOTE — Telephone Encounter (Signed)
In person please

## 2021-01-01 ENCOUNTER — Ambulatory Visit (INDEPENDENT_AMBULATORY_CARE_PROVIDER_SITE_OTHER): Payer: 59 | Admitting: Primary Care

## 2021-01-01 ENCOUNTER — Other Ambulatory Visit: Payer: Self-pay

## 2021-01-01 ENCOUNTER — Encounter: Payer: Self-pay | Admitting: Primary Care

## 2021-01-01 ENCOUNTER — Ambulatory Visit (INDEPENDENT_AMBULATORY_CARE_PROVIDER_SITE_OTHER)
Admission: RE | Admit: 2021-01-01 | Discharge: 2021-01-01 | Disposition: A | Discharge status: Home / Self Care | Payer: 59 | Source: Ambulatory Visit | Attending: Primary Care | Admitting: Primary Care

## 2021-01-01 VITALS — BP 120/64 | HR 92 | Temp 98.5°F | Wt 228.2 lb

## 2021-01-01 DIAGNOSIS — R109 Unspecified abdominal pain: Secondary | ICD-10-CM

## 2021-01-01 DIAGNOSIS — R1012 Left upper quadrant pain: Secondary | ICD-10-CM | POA: Insufficient documentation

## 2021-01-01 DIAGNOSIS — R1032 Left lower quadrant pain: Secondary | ICD-10-CM | POA: Insufficient documentation

## 2021-01-01 HISTORY — DX: Unspecified abdominal pain: R10.9

## 2021-01-01 LAB — POC URINALSYSI DIPSTICK (AUTOMATED)
Bilirubin, UA: NEGATIVE
Glucose, UA: NEGATIVE
Nitrite, UA: NEGATIVE
Protein, UA: POSITIVE — AB
Spec Grav, UA: 1.02 (ref 1.010–1.025)
Urobilinogen, UA: 0.2 E.U./dL
pH, UA: 5.5 (ref 5.0–8.0)

## 2021-01-01 MED ORDER — TAMSULOSIN HCL 0.4 MG PO CAPS: 0.4000 mg | ORAL_CAPSULE | Freq: Every day | ORAL | 0 refills | Status: DC

## 2021-01-01 NOTE — Assessment & Plan Note (Addendum)
Acute for the last 3 days, also with diarrhea that has resolved.  Colonoscopy from February 2022 reviewed which does not show evidence of diverticulosis.  Suspect symptoms are secondary to renal stone involvement.  Checking abdominal plain films today.  Also checking CBC with differential and BMP to rule out pyelonephritis.  UA today with trace leuks, trace blood. Culture sent.  Update: Adequate glands abdominal x-ray shows evidence what appears to be of renal stones.  We will send Flomax 0.4 milligrams for her to try.  She was updated.

## 2021-01-01 NOTE — Patient Instructions (Signed)
Stop by the lab and xray prior to leaving today. I will notify you of your results once received.   Ensure you are consuming 64 ounces of water daily.  You can take Tylenol or Advil as needed for pain.   It was a pleasure to see you today!

## 2021-01-01 NOTE — Progress Notes (Signed)
Subjective:    Patient ID: Barbara Vasquez, female    DOB: 1971-03-04, 50 y.o.   MRN: [id number]  HPI  This visit occurred during the SARS-CoV-2 public health emergency.  Safety protocols were in place, including screening questions prior to the visit, additional usage of staff PPE, and extensive cleaning of exam room while observing appropriate contact time as indicated for disinfecting solutions.   Barbara Vasquez is a 50 year old female with a history of hypertension, hyperlipidemia, prediabetes, depression who presents today with a chief complaint of flank pain.  Symptoms began 3 days ago with flank pain located to the left flank with radiation around to her left lateral side and lower abdomen. She has also experienced diarrhea over the last two days which has since resolved.   She denies bloody stools, fevers, urinary frequency, urinary urgency, nausea, vomiting.  Her pain is intermittent.  BP Readings from Last 3 Encounters:  01/01/21 120/64  12/24/20 130/80  12/08/20 (!) 155/96     Review of Systems  Constitutional: Negative for fever.  Gastrointestinal: Positive for diarrhea. Negative for nausea and vomiting.  Genitourinary: Positive for flank pain. Negative for dysuria, frequency and pelvic pain.       Past Medical History:  Diagnosis Date  . Essential hypertension   . Heart murmur    as child  . Hypercholesteremia      Social History   Socioeconomic History  . Marital status: Single    Spouse name: Not on file  . Number of children: Not on file  . Years of education: Not on file  . Highest education level: Not on file  Occupational History  . Not on file  Tobacco Use  . Smoking status: Never Smoker  . Smokeless tobacco: Never Used  Vaping Use  . Vaping Use: Never used  Substance and Sexual Activity  . Alcohol use: Yes    Comment: occasional  . Drug use: No  . Sexual activity: Yes    Birth control/protection: Surgical  Other Topics Concern  . Not  on file  Social History Narrative   Moved from [place].   2 children.  5 grandchildren.   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   Social Determinants of Health   Financial Resource Strain: Not on file  Food Insecurity: Not on file  Transportation Needs: Not on file  Physical Activity: Not on file  Stress: Not on file  Social Connections: Not on file  Intimate Partner Violence: Not on file    Past Surgical History:  Procedure Laterality Date  . ABDOMINAL HYSTERECTOMY    . CHOLECYSTECTOMY    . COLONOSCOPY WITH PROPOFOL N/A 12/04/2020   Procedure: COLONOSCOPY WITH PROPOFOL;  Surgeon: Toney Reil, MD;  Location: [facility] ENDOSCOPY;  Service: Gastroenterology;  Laterality: N/A;  . HAND TENDON SURGERY Left 04/01/2020  . WRIST ARTHROSCOPY Left 12/11/2019   with  TFC debridment, ulanr shortening osteotomy    Family History  Problem Relation Age of Onset  . Hypertension Father   . Hypertension Sister   . Diabetes Maternal Aunt   . Breast cancer Paternal Grandmother 66    Allergies  Allergen Reactions  . Penicillins Swelling, Hives and Other (See Comments)    Swelling of the throat Swelling of the throat, chest pain    Current Outpatient Medications on File Prior to Visit  Medication Sig Dispense Refill  . amLODipine (NORVASC) 10 MG tablet Take 1 tablet (10 mg  total) by mouth daily. For blood pressure. 90 tablet 3  . atorvastatin (LIPITOR) 40 MG tablet Take 1 tablet (40 mg total) by mouth at bedtime. For cholesterol 90 tablet 3  . sertraline (ZOLOFT) 50 MG tablet Take 1 tablet (50 mg total) by mouth daily. For depression. 30 tablet 1  . methocarbamol (ROBAXIN) 500 MG tablet Take 500 mg by mouth 4 (four) times daily as needed.     No current facility-administered medications on file prior to visit.    BP 120/64   Pulse 92   Temp 98.5 F (36.9 C) (Temporal)   Wt 228 lb 4 oz (103.5 kg)   SpO2 97%   BMI 37.98 kg/m    Objective:    Physical Exam Constitutional:      Appearance: She is well-nourished.  Cardiovascular:     Rate and Rhythm: Normal rate and regular rhythm.  Pulmonary:     Effort: Pulmonary effort is normal.     Breath sounds: Normal breath sounds.  Abdominal:     Tenderness: There is no abdominal tenderness. There is left CVA tenderness. There is no right CVA tenderness.  Musculoskeletal:     Cervical back: Neck supple.  Skin:    General: Skin is warm and dry.  Psychiatric:        Mood and Affect: Mood and affect normal.            Assessment & Plan:

## 2021-01-01 NOTE — Addendum Note (Signed)
Addended by: Doreene Nest on: 01/01/2021 04:03 PM   Modules accepted: Orders

## 2021-01-02 LAB — BASIC METABOLIC PANEL
BUN: 13 mg/dL (ref 6–23)
CO2: 25 mEq/L (ref 19–32)
Calcium: 8.9 mg/dL (ref 8.4–10.5)
Chloride: 102 mEq/L (ref 96–112)
Creatinine, Ser: 1.04 mg/dL (ref 0.40–1.20)
GFR: 62.52 mL/min (ref >60.00)
Glucose, Bld: 91 mg/dL (ref 70–99)
Potassium: 3.5 mEq/L (ref 3.5–5.1)
Sodium: 136 mEq/L (ref 135–145)

## 2021-01-02 LAB — CBC WITH DIFFERENTIAL/PLATELET
Basophils Absolute: 0.1 10*3/uL (ref 0.0–0.1)
Basophils Relative: 0.9 % (ref 0.0–3.0)
Eosinophils Absolute: 0.1 10*3/uL (ref 0.0–0.7)
Eosinophils Relative: 1.2 % (ref 0.0–5.0)
HCT: 39 % (ref 36.0–46.0)
Hemoglobin: 13.2 g/dL (ref 12.0–15.0)
Lymphocytes Relative: 39.7 % (ref 12.0–46.0)
Lymphs Abs: 3.4 10*3/uL (ref 0.7–4.0)
MCHC: 34 g/dL (ref 30.0–36.0)
MCV: 93.1 fl (ref 78.0–100.0)
Monocytes Absolute: 0.9 10*3/uL (ref 0.1–1.0)
Monocytes Relative: 10.3 % (ref 3.0–12.0)
Neutro Abs: 4.1 10*3/uL (ref 1.4–7.7)
Neutrophils Relative %: 47.9 % (ref 43.0–77.0)
Platelets: 369 10*3/uL (ref 150.0–400.0)
RBC: 4.19 Mil/uL (ref 3.87–5.11)
RDW: 13.3 % (ref 11.5–15.5)
WBC: 8.6 10*3/uL (ref 4.0–10.5)

## 2021-01-02 LAB — URINE CULTURE
MICRO NUMBER:: 11603644
SPECIMEN QUALITY:: ADEQUATE

## 2021-01-03 DIAGNOSIS — R1032 Left lower quadrant pain: Secondary | ICD-10-CM

## 2021-01-03 DIAGNOSIS — R109 Unspecified abdominal pain: Secondary | ICD-10-CM

## 2021-01-05 ENCOUNTER — Ambulatory Visit (INDEPENDENT_AMBULATORY_CARE_PROVIDER_SITE_OTHER): Payer: 59 | Admitting: Psychology

## 2021-01-05 DIAGNOSIS — F331 Major depressive disorder, recurrent, moderate: Secondary | ICD-10-CM

## 2021-01-05 NOTE — Telephone Encounter (Signed)
Added from second message.   Good afternoon I'm still in pain and that pain is the same. The pain level elevated some though. And also can I get a copy of my x-ray please.    Thank you Clarity

## 2021-01-05 NOTE — Telephone Encounter (Signed)
Added to open message.  

## 2021-01-05 NOTE — Telephone Encounter (Signed)
Added to another open message  

## 2021-01-06 NOTE — Telephone Encounter (Signed)
Patient called in asking that CT scan order be placed asap and be done soon as she is worrying about pain in her side. Please advise.

## 2021-01-07 ENCOUNTER — Other Ambulatory Visit: Payer: Self-pay

## 2021-01-07 ENCOUNTER — Telehealth: Payer: Self-pay

## 2021-01-07 ENCOUNTER — Other Ambulatory Visit: Payer: Self-pay | Admitting: Primary Care

## 2021-01-07 ENCOUNTER — Ambulatory Visit
Admission: RE | Admit: 2021-01-07 | Discharge: 2021-01-07 | Disposition: A | Discharge status: Home / Self Care | Payer: 59 | Source: Ambulatory Visit | Attending: Primary Care | Admitting: Primary Care

## 2021-01-07 DIAGNOSIS — R1032 Left lower quadrant pain: Secondary | ICD-10-CM | POA: Insufficient documentation

## 2021-01-07 NOTE — Telephone Encounter (Signed)
See result note.  

## 2021-01-07 NOTE — Telephone Encounter (Signed)
Received call from [facility] with results. Barbara Vasquez was informed and reviewed. Per Barbara Vasquez informed to let patient know that we will give a call with results soon.

## 2021-01-14 ENCOUNTER — Ambulatory Visit (INDEPENDENT_AMBULATORY_CARE_PROVIDER_SITE_OTHER): Payer: 59 | Admitting: Psychology

## 2021-01-14 DIAGNOSIS — F331 Major depressive disorder, recurrent, moderate: Secondary | ICD-10-CM

## 2021-01-30 ENCOUNTER — Ambulatory Visit (INDEPENDENT_AMBULATORY_CARE_PROVIDER_SITE_OTHER): Payer: 59 | Admitting: Psychology

## 2021-01-30 DIAGNOSIS — F331 Major depressive disorder, recurrent, moderate: Secondary | ICD-10-CM

## 2021-03-02 ENCOUNTER — Ambulatory Visit (INDEPENDENT_AMBULATORY_CARE_PROVIDER_SITE_OTHER): Payer: 59 | Admitting: Psychology

## 2021-03-02 DIAGNOSIS — F331 Major depressive disorder, recurrent, moderate: Secondary | ICD-10-CM

## 2021-04-03 ENCOUNTER — Ambulatory Visit: Payer: 59 | Admitting: Psychology

## 2021-08-03 NOTE — Telephone Encounter (Signed)
Ppw received and placed in your folder for review.

## 2021-08-03 NOTE — Telephone Encounter (Signed)
Please advise 

## 2021-08-04 NOTE — Telephone Encounter (Signed)
Completed and placed in Joellen's inbox. 

## 2021-08-04 NOTE — Telephone Encounter (Signed)
Pt called checking to see if Barbara Vasquez received the call from [employer].

## 2021-09-29 ENCOUNTER — Telehealth (INDEPENDENT_AMBULATORY_CARE_PROVIDER_SITE_OTHER): Payer: 59 | Admitting: Primary Care

## 2021-09-29 ENCOUNTER — Other Ambulatory Visit: Payer: Self-pay | Admitting: Primary Care

## 2021-09-29 ENCOUNTER — Encounter: Payer: Self-pay | Admitting: Primary Care

## 2021-09-29 DIAGNOSIS — F331 Major depressive disorder, recurrent, moderate: Secondary | ICD-10-CM

## 2021-09-29 DIAGNOSIS — F321 Major depressive disorder, single episode, moderate: Secondary | ICD-10-CM

## 2021-09-29 MED ORDER — SERTRALINE HCL 25 MG PO TABS: 25.0000 mg | ORAL_TABLET | Freq: Every day | ORAL | 0 refills | Status: DC

## 2021-09-29 NOTE — Telephone Encounter (Signed)
Called patient added on for virtual with you today.

## 2021-09-29 NOTE — Assessment & Plan Note (Signed)
Inappropriate use of sertraline, which does seem to be effective when used appropriately.   Rx for sertraline 25 mg sent to pharmacy. Stressed the importance of daily compliance, she verbalized understanding.   She denied SI/HI today, has no plan. Contracts to safety.  She will update via MyChart in 1 month. We will plan to see her back in February 2023 for follow up.

## 2021-09-29 NOTE — Patient Instructions (Signed)
Start sertraline 25 mg once daily for anxiety and depression. Take this everyday!  Please update me via MyChart in one month as discussed.  It was a pleasure to see you today!

## 2021-09-29 NOTE — Progress Notes (Signed)
Patient ID: Barbara Vasquez, female    DOB: 08/31/1971, 50 y.o.   MRN: [id number]  Virtual visit completed through [employer], a video enabled telemedicine application. Due to national recommendations of social distancing due to COVID-19, a virtual visit is felt to be most appropriate for this patient at this time. Reviewed limitations, risks, security and privacy concerns of performing a virtual visit and the availability of in person appointments. I also reviewed that there may be a patient responsible charge related to this service. The patient agreed to proceed.   Patient location: home Provider location: [place] at [facility], office Persons participating in this virtual visit: patient, provider   If any vitals were documented, they were collected by patient at home unless specified below.    Ht 5\' 5"  (1.651 m)   Wt 246 lb (111.6 kg)   BMI 40.94 kg/m    CC: Depression Subjective:   HPI: Barbara Vasquez is a 50 y.o. female with a history of hypertension, prediabetes, hyperlipidemia, MDD presenting on 09/29/2021 for Depression  Previously prescribed sertraline 50 mg for symptoms of depression stemming from work injury in March 2021. Evaluated in January 2022 and initiated on Zoloft 25 mg for 1 week, then 50 mg. One week following she endorsed feeling somewhat better and would update February 2022 within a few weeks.  Today she endorses symptoms of tearfulness, feeling sad/down, difficulty sleeping. Two weeks ago she had some suicidal thoughts, none since and none today. She endorses not taking her Zoloft 50 mg as prescribed, was taking 1/2 tablet irregularly. Over the last few months she's been taking 1/2 tablet every few days. While taking Zoloft 25 mg she noticed improvement in her mood and felt overall better. She is requesting a refill.   She could not tolerate the 50 mg dose as it "made me feel jittery".      Relevant past medical, surgical, family and social history reviewed  and updated as indicated. Interim medical history since our last visit reviewed. Allergies and medications reviewed and updated. Outpatient Medications Prior to Visit  Medication Sig Dispense Refill   amLODipine (NORVASC) 10 MG tablet Take 1 tablet (10 mg total) by mouth daily. For blood pressure. 90 tablet 3   atorvastatin (LIPITOR) 40 MG tablet Take 1 tablet (40 mg total) by mouth at bedtime. For cholesterol 90 tablet 3   tamsulosin (FLOMAX) 0.4 MG CAPS capsule Take 1 capsule (0.4 mg total) by mouth daily. For urine flow. 30 capsule 0   sertraline (ZOLOFT) 50 MG tablet Take 1 tablet (50 mg total) by mouth daily. For depression. (Patient not taking: Reported on 09/29/2021) 30 tablet 1   No facility-administered medications prior to visit.     Per HPI unless specifically indicated in ROS section below Review of Systems Objective:  Ht 5\' 5"  (1.651 m)   Wt 246 lb (111.6 kg)   BMI 40.94 kg/m   Wt Readings from Last 3 Encounters:  09/29/21 246 lb (111.6 kg)  01/01/21 228 lb 4 oz (103.5 kg)  12/24/20 230 lb (104.3 kg)       Physical exam: General: Alert and oriented x 3, no distress, does not appear sickly  Pulmonary: Speaks in complete sentences without increased work of breathing, no cough during visit.  Psychiatric: Normal mood, thought content, and behavior.     Results for orders placed or performed in visit on 01/01/21  Urine Culture   Specimen: Blood  Result Value Ref Range   MICRO NUMBER: 12/26/20  SPECIMEN QUALITY: Adequate    Sample Source URINE, CLEAN CATCH    STATUS: FINAL    ISOLATE 1:      Mixed genital flora isolated. These superficial bacteria are not indicative of a urinary tract infection. No further organism identification is warranted on this specimen. If clinically indicated, recollect clean-catch, mid-stream urine and transfer  immediately to Urine Culture Transport Tube.   CBC with Differential/Platelet  Result Value Ref Range   WBC 8.6 4.0 - 10.5  K/uL   RBC 4.19 3.87 - 5.11 Mil/uL   Hemoglobin 13.2 12.0 - 15.0 g/dL   HCT 21.3 08.6 - 57.8 %   MCV 93.1 78.0 - 100.0 fl   MCHC 34.0 30.0 - 36.0 g/dL   RDW 46.9 62.9 - 52.8 %   Platelets 369.0 150.0 - 400.0 K/uL   Neutrophils Relative % 47.9 43.0 - 77.0 %   Lymphocytes Relative 39.7 12.0 - 46.0 %   Monocytes Relative 10.3 3.0 - 12.0 %   Eosinophils Relative 1.2 0.0 - 5.0 %   Basophils Relative 0.9 0.0 - 3.0 %   Neutro Abs 4.1 1.4 - 7.7 K/uL   Lymphs Abs 3.4 0.7 - 4.0 K/uL   Monocytes Absolute 0.9 0.1 - 1.0 K/uL   Eosinophils Absolute 0.1 0.0 - 0.7 K/uL   Basophils Absolute 0.1 0.0 - 0.1 K/uL  Basic metabolic panel  Result Value Ref Range   Sodium 136 135 - 145 mEq/L   Potassium 3.5 3.5 - 5.1 mEq/L   Chloride 102 96 - 112 mEq/L   CO2 25 19 - 32 mEq/L   Glucose, Bld 91 70 - 99 mg/dL   BUN 13 6 - 23 mg/dL   Creatinine, Ser 4.13 0.40 - 1.20 mg/dL   GFR 24.40 >10.27 mL/min   Calcium 8.9 8.4 - 10.5 mg/dL  POCT Urinalysis Dipstick (Automated)  Result Value Ref Range   Color, UA yellow    Clarity, UA cloudy    Glucose, UA Negative Negative   Bilirubin, UA neg    Ketones, UA trace    Spec Grav, UA 1.020 1.010 - 1.025   Blood, UA trace    pH, UA 5.5 5.0 - 8.0   Protein, UA Positive (A) Negative   Urobilinogen, UA 0.2 0.2 or 1.0 E.U./dL   Nitrite, UA neg    Leukocytes, UA Trace (A) Negative   Assessment & Plan:   Problem List Items Addressed This Visit       Other   MDD (major depressive disorder)    Inappropriate use of sertraline, which does seem to be effective when used appropriately.   Rx for sertraline 25 mg sent to pharmacy. Stressed the importance of daily compliance, she verbalized understanding.   She denied SI/HI today, has no plan. Contracts to safety.  She will update via MyChart in 1 month. We will plan to see her back in February 2023 for follow up.      Relevant Medications   sertraline (ZOLOFT) 25 MG tablet     Meds ordered this encounter   Medications   sertraline (ZOLOFT) 25 MG tablet    Sig: Take 1 tablet (25 mg total) by mouth daily. For anxiety and depression    Dispense:  90 tablet    Refill:  0    Order Specific Question:   Supervising Provider    Answer:   BEDSOLE, AMY E [[id number]]   No orders of the defined types were placed in this encounter.   I discussed the  assessment and treatment plan with the patient. The patient was provided an opportunity to ask questions and all were answered. The patient agreed with the plan and demonstrated an understanding of the instructions. The patient was advised to call back or seek an in-person evaluation if the symptoms worsen or if the condition fails to improve as anticipated.  Follow up plan:  Start sertraline 25 mg once daily for anxiety and depression. Take this everyday!  Please update me via MyChart in one month as discussed.  It was a pleasure to see you today!   Doreene Nest, NP

## 2021-09-30 NOTE — Telephone Encounter (Signed)
Can we fax the requested information to her lawyer? Just wanted to double check.

## 2021-10-01 ENCOUNTER — Telehealth: Payer: Self-pay | Admitting: Primary Care

## 2021-10-01 NOTE — Telephone Encounter (Signed)
Called patient let know that we will let her know that we will print copy of letter for her to pick up so that she does not have to print off.   She was given 50 mg tab on 11/26/2020. She was taking 1/2 tab. She was given 30 with one refill. Her next script was given on 11/26/20 for 25mg .

## 2021-10-01 NOTE — Telephone Encounter (Signed)
Those dates don't make sense, correct dates applied to letter.  Barbara Vasquez, I can't print her note for some reason.  Can you print?

## 2021-10-01 NOTE — Telephone Encounter (Signed)
Called patient back let know that letter has been corrected and at reception for pick up.

## 2021-10-02 NOTE — Telephone Encounter (Signed)
Spoke to patient and let know letter ready for pick up. Shanda Bumps printed for me and put at reception.

## 2021-11-03 NOTE — Telephone Encounter (Signed)
Called and spoke to pt on both issues

## 2022-01-29 ENCOUNTER — Other Ambulatory Visit: Payer: Self-pay | Admitting: Primary Care

## 2022-01-29 ENCOUNTER — Ambulatory Visit (INDEPENDENT_AMBULATORY_CARE_PROVIDER_SITE_OTHER): Payer: 59 | Admitting: Primary Care

## 2022-01-29 ENCOUNTER — Encounter: Payer: Self-pay | Admitting: Primary Care

## 2022-01-29 ENCOUNTER — Ambulatory Visit (INDEPENDENT_AMBULATORY_CARE_PROVIDER_SITE_OTHER)
Admission: RE | Admit: 2022-01-29 | Discharge: 2022-01-29 | Disposition: A | Discharge status: Home / Self Care | Payer: 59 | Source: Ambulatory Visit | Attending: Primary Care | Admitting: Primary Care

## 2022-01-29 VITALS — BP 136/80 | HR 83 | Ht 65.0 in | Wt 237.0 lb

## 2022-01-29 DIAGNOSIS — Z1231 Encounter for screening mammogram for malignant neoplasm of breast: Secondary | ICD-10-CM

## 2022-01-29 DIAGNOSIS — R1013 Epigastric pain: Secondary | ICD-10-CM

## 2022-01-29 DIAGNOSIS — E876 Hypokalemia: Secondary | ICD-10-CM

## 2022-01-29 DIAGNOSIS — R7303 Prediabetes: Secondary | ICD-10-CM

## 2022-01-29 DIAGNOSIS — E785 Hyperlipidemia, unspecified: Secondary | ICD-10-CM

## 2022-01-29 DIAGNOSIS — Z0001 Encounter for general adult medical examination with abnormal findings: Secondary | ICD-10-CM | POA: Diagnosis not present

## 2022-01-29 DIAGNOSIS — I10 Essential (primary) hypertension: Secondary | ICD-10-CM | POA: Diagnosis not present

## 2022-01-29 DIAGNOSIS — F330 Major depressive disorder, recurrent, mild: Secondary | ICD-10-CM | POA: Diagnosis not present

## 2022-01-29 LAB — LIPID PANEL
Cholesterol: 260 mg/dL — ABNORMAL HIGH (ref 0–200)
HDL: 37.5 mg/dL — ABNORMAL LOW (ref >39.00)
LDL Cholesterol: 193 mg/dL — ABNORMAL HIGH (ref 0–99)
NonHDL: 222.4
Total CHOL/HDL Ratio: 7
Triglycerides: 145 mg/dL (ref 0.0–149.0)
VLDL: 29 mg/dL (ref 0.0–40.0)

## 2022-01-29 LAB — CBC
HCT: 39.9 % (ref 36.0–46.0)
Hemoglobin: 13.4 g/dL (ref 12.0–15.0)
MCHC: 33.6 g/dL (ref 30.0–36.0)
MCV: 92.4 fl (ref 78.0–100.0)
Platelets: 380 10*3/uL (ref 150.0–400.0)
RBC: 4.32 Mil/uL (ref 3.87–5.11)
RDW: 13.1 % (ref 11.5–15.5)
WBC: 8.8 10*3/uL (ref 4.0–10.5)

## 2022-01-29 LAB — COMPREHENSIVE METABOLIC PANEL
ALT: 22 U/L (ref 0–35)
AST: 19 U/L (ref 0–37)
Albumin: 4.3 g/dL (ref 3.5–5.2)
Alkaline Phosphatase: 110 U/L (ref 39–117)
BUN: 12 mg/dL (ref 6–23)
CO2: 28 mEq/L (ref 19–32)
Calcium: 9.2 mg/dL (ref 8.4–10.5)
Chloride: 102 mEq/L (ref 96–112)
Creatinine, Ser: 0.93 mg/dL (ref 0.40–1.20)
GFR: 70.95 mL/min (ref >60.00)
Glucose, Bld: 99 mg/dL (ref 70–99)
Potassium: 3.3 mEq/L — ABNORMAL LOW (ref 3.5–5.1)
Sodium: 135 mEq/L (ref 135–145)
Total Bilirubin: 0.4 mg/dL (ref 0.2–1.2)
Total Protein: 8.5 g/dL — ABNORMAL HIGH (ref 6.0–8.3)

## 2022-01-29 LAB — HEMOGLOBIN A1C: Hgb A1c MFr Bld: 6.2 % (ref 4.6–6.5)

## 2022-01-29 LAB — LIPASE: Lipase: 14 U/L (ref 11.0–59.0)

## 2022-01-29 MED ORDER — ROSUVASTATIN CALCIUM 5 MG PO TABS: 5.0000 mg | ORAL_TABLET | Freq: Every day | ORAL | 0 refills | Status: DC

## 2022-01-29 MED ORDER — POTASSIUM CHLORIDE CRYS ER 20 MEQ PO TBCR: 20.0000 meq | EXTENDED_RELEASE_TABLET | Freq: Two times a day (BID) | ORAL | 0 refills | Status: DC

## 2022-01-29 NOTE — Assessment & Plan Note (Signed)
Inconsistent use of atorvastatin 40 mg due to symptoms of myalgias.  ? ?She's not taken atorvastatin in weeks. ?Repeat lipid panel pending. ? ? ?

## 2022-01-29 NOTE — Assessment & Plan Note (Signed)
Mostly mid abdominal region. ? ?Checking abdominal plain films today to evaluate for constipation. ? ?Lipase pending along with other routine labs. ? ?We had a long discussion about her diet and what constitutes as high-fiber.  I recommended she increase activity level. ? ?Await results. ?

## 2022-01-29 NOTE — Patient Instructions (Addendum)
Stop by the lab and xray prior to leaving today. I will notify you of your results once received.  ? ?Consider using the app called my fitness pal for calorie counting and weight monitoring. ? ?Increase exercise. You should be getting 150 minutes of moderate intensity exercise weekly. ? ?Call the [facility] to schedule your mammogram.  ? ?It was a pleasure to see you today! ? ? ?Preventive Care 55-52 Years Old, Female ?Preventive care refers to lifestyle choices and visits with your health care provider that can promote health and wellness. Preventive care visits are also called wellness exams. ?What can I expect for my preventive care visit? ?Counseling ?Your health care provider may ask you questions about your: ?Medical history, including: ?Past medical problems. ?Family medical history. ?Pregnancy history. ?Current health, including: ?Menstrual cycle. ?Method of birth control. ?Emotional well-being. ?Home life and relationship well-being. ?Sexual activity and sexual health. ?Lifestyle, including: ?Alcohol, nicotine or tobacco, and drug use. ?Access to firearms. ?Diet, exercise, and sleep habits. ?Work and work Statistician. ?Sunscreen use. ?Safety issues such as seatbelt and bike helmet use. ?Physical exam ?Your health care provider will check your: ?Height and weight. These may be used to calculate your BMI (body mass index). BMI is a measurement that tells if you are at a healthy weight. ?Waist circumference. This measures the distance around your waistline. This measurement also tells if you are at a healthy weight and may help predict your risk of certain diseases, such as type 2 diabetes and high blood pressure. ?Heart rate and blood pressure. ?Body temperature. ?Skin for abnormal spots. ?What immunizations do I need? ?Vaccines are usually given at various ages, according to a schedule. Your health care provider will recommend vaccines for you based on your age, medical history, and lifestyle or other  factors, such as travel or where you work. ?What tests do I need? ?Screening ?Your health care provider may recommend screening tests for certain conditions. This may include: ?Lipid and cholesterol levels. ?Diabetes screening. This is done by checking your blood sugar (glucose) after you have not eaten for a while (fasting). ?Pelvic exam and Pap test. ?Hepatitis B test. ?Hepatitis C test. ?HIV (human immunodeficiency virus) test. ?STI (sexually transmitted infection) testing, if you are at risk. ?Lung cancer screening. ?Colorectal cancer screening. ?Mammogram. Talk with your health care provider about when you should start having regular mammograms. This may depend on whether you have a family history of breast cancer. ?BRCA-related cancer screening. This may be done if you have a family history of breast, ovarian, tubal, or peritoneal cancers. ?Bone density scan. This is done to screen for osteoporosis. ?Talk with your health care provider about your test results, treatment options, and if necessary, the need for more tests. ?Follow these instructions at home: ?Eating and drinking ? ?Eat a diet that includes fresh fruits and vegetables, whole grains, lean protein, and low-fat dairy products. ?Take vitamin and mineral supplements as recommended by your health care provider. ?Do not drink alcohol if: ?Your health care provider tells you not to drink. ?You are pregnant, may be pregnant, or are planning to become pregnant. ?If you drink alcohol: ?Limit how much you have to 0-1 drink a day. ?Know how much alcohol is in your drink. In the [place] one drink equals one 12 oz bottle of beer (355 mL), one 5 oz glass of wine (148 mL), or one 1? oz glass of hard liquor (44 mL). ?Lifestyle ?Brush your teeth every morning and night with fluoride toothpaste. Floss  one time each day. ?Exercise for at least 30 minutes 5 or more days each week. ?Do not use any products that contain nicotine or tobacco. These products include  cigarettes, chewing tobacco, and vaping devices, such as e-cigarettes. If you need help quitting, ask your health care provider. ?Do not use drugs. ?If you are sexually active, practice safe sex. Use a condom or other form of protection to prevent STIs. ?If you do not wish to become pregnant, use a form of birth control. If you plan to become pregnant, see your health care provider for a prepregnancy visit. ?Take aspirin only as told by your health care provider. Make sure that you understand how much to take and what form to take. Work with your health care provider to find out whether it is safe and beneficial for you to take aspirin daily. ?Find healthy ways to manage stress, such as: ?Meditation, yoga, or listening to music. ?Journaling. ?Talking to a trusted person. ?Spending time with friends and family. ?Minimize exposure to UV radiation to reduce your risk of skin cancer. ?Safety ?Always wear your seat belt while driving or riding in a vehicle. ?Do not drive: ?If you have been drinking alcohol. Do not ride with someone who has been drinking. ?When you are tired or distracted. ?While texting. ?If you have been using any mind-altering substances or drugs. ?Wear a helmet and other protective equipment during sports activities. ?If you have firearms in your house, make sure you follow all gun safety procedures. ?Seek help if you have been physically or sexually abused. ?What's next? ?Visit your health care provider once a year for an annual wellness visit. ?Ask your health care provider how often you should have your eyes and teeth checked. ?Stay up to date on all vaccines. ?This information is not intended to replace advice given to you by your health care provider. Make sure you discuss any questions you have with your health care provider. ?Document Revised: 04/15/2021 Document Reviewed: 04/15/2021 ?Elsevier Patient Education ? [employer]. ? ?

## 2022-01-29 NOTE — Assessment & Plan Note (Addendum)
Above goal today despite compliance to amlodipine 10 mg. Improved slightly on recheck. ? ?Strongly advised her to work on weight loss through diet and exercise.  We had a long discussion of what healthy diet appears like, discussed calorie counting/weight watchers. ? ?Continue to monitor. ?Continue amlodipine 10 mg daily. ?

## 2022-01-29 NOTE — Assessment & Plan Note (Signed)
Controlled and feeling well.  ? ?She stopped taking sertraline 25 mg as it caused her to feel worse. She did not notify us of this. ? ?Continue off sertraline 25 mg.  ? ?Continue to monitor.  ?

## 2022-01-29 NOTE — Assessment & Plan Note (Signed)
Discussed the importance of a healthy diet and regular exercise in order for weight loss, and to reduce the risk of further co-morbidity. ? ?Repeat A1C pending. ?

## 2022-01-29 NOTE — Assessment & Plan Note (Signed)
Declines Shingrix vaccine.  Tetanus due next year. ?Mammogram due, orders placed. ?Colonoscopy up-to-date, due 2032. ? ?Discussed the importance of a healthy diet and regular exercise in order for weight loss, and to reduce the risk of further co-morbidity. ? ?Exam today as noted. ?Labs pending. ?

## 2022-01-29 NOTE — Progress Notes (Signed)
? ?Subjective:  ? ? Patient ID: Barbara Vasquez, female    DOB: Nov 23, 1970, 51 y.o.   MRN: [id number] ? ?HPI ? ?Barbara Vasquez is a very pleasant 51 y.o. female who presents today for complete physical and follow up of chronic conditions. ? ?She would also like to discuss changes in bowel patterns and abdominal pain Two weeks ago she developed mid abdominal cramping/pain/soreness, smaller amounts of bowel movements which are harder than usual. She's having less frequent movements. She drinks plenty of water. She does walk sometimes. She eats vegetable and fruits. She denies a lot of fast food, but does eat out at restaurants. She denies rectal bleeding, vomiting, GERD symptoms.  ? ?Immunizations: ?-Tetanus: 2014 ?-Influenza: Did not complete this season  ?-Covid-19: 3 vaccines  ?-Shingles: Never completed, declines  ? ?Diet: Fair diet.  ?Exercise: Some walking.  ? ?Eye exam: Completes annually  ?Dental exam: Completes semi-annually  ? ?Pap Smear: Hysterectomy  ?Mammogram: Completed in February 2022 ?Colonoscopy: Completed in February 2022, due 2032 ? ?BP Readings from Last 3 Encounters:  ?01/29/22 136/80  ?01/01/21 120/64  ?12/24/20 130/80  ? ? ?Wt Readings from Last 3 Encounters:  ?01/29/22 237 lb (107.5 kg)  ?09/29/21 246 lb (111.6 kg)  ?01/01/21 228 lb 4 oz (103.5 kg)  ? ? ? ? ?Review of Systems  ?Constitutional:  Negative for unexpected weight change.  ?HENT:  Negative for rhinorrhea.   ?Respiratory:  Negative for cough and shortness of breath.   ?Cardiovascular:  Negative for chest pain.  ?Gastrointestinal:  Positive for abdominal pain and constipation. Negative for blood in stool and diarrhea.  ?Genitourinary:  Negative for difficulty urinating.  ?Musculoskeletal:  Negative for arthralgias and myalgias.  ?Skin:  Negative for rash.  ?Allergic/Immunologic: Negative for environmental allergies.  ?Neurological:  Negative for dizziness and headaches.  ?Psychiatric/Behavioral:  The patient is not  nervous/anxious.   ? ?   ? ? ?Past Medical History:  ?Diagnosis Date  ? Essential hypertension   ? Flank pain 01/01/2021  ? Heart murmur   ? as child  ? Hypercholesteremia   ? ? ?Social History  ? ?Socioeconomic History  ? Marital status: Single  ?  Spouse name: Not on file  ? Number of children: Not on file  ? Years of education: Not on file  ? Highest education level: Not on file  ?Occupational History  ? Not on file  ?Tobacco Use  ? Smoking status: Never  ? Smokeless tobacco: Never  ?Vaping Use  ? Vaping Use: Never used  ?Substance and Sexual Activity  ? Alcohol use: Yes  ?  Comment: occasional  ? Drug use: No  ? Sexual activity: Yes  ?  Birth control/protection: Surgical  ?Other Topics Concern  ? Not on file  ?Social History Narrative  ? Moved from [place].  ? 2 children.  5 grandchildren.  ? Works in a call center.  ? 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  ? ?Social Determinants of Health  ? ?Financial Resource Strain: Not on file  ?Food Insecurity: Not on file  ?Transportation Needs: Not on file  ?Physical Activity: Not on file  ?Stress: Not on file  ?Social Connections: Not on file  ?Intimate Partner Violence: Not on file  ? ? ?Past Surgical History:  ?Procedure Laterality Date  ? ABDOMINAL HYSTERECTOMY    ? CHOLECYSTECTOMY    ? COLONOSCOPY WITH PROPOFOL N/A 12/04/2020  ? Procedure: COLONOSCOPY WITH PROPOFOL;  Surgeon: Toney Reil, MD;  Location: [facility] ENDOSCOPY;  Service: Gastroenterology;  Laterality: N/A;  ? HAND TENDON SURGERY Left 04/01/2020  ? WRIST ARTHROSCOPY Left 12/11/2019  ? with  TFC debridment, ulanr shortening osteotomy  ? ? ?Family History  ?Problem Relation Age of Onset  ? Hypertension Father   ? Hypertension Sister   ? Diabetes Maternal Aunt   ? Breast cancer Paternal Grandmother 57  ? ? ?Allergies  ?Allergen Reactions  ? Penicillins Swelling, Hives and Other (See Comments)  ?  Swelling of the throat ?Swelling of the throat, chest pain  ? ? ?Current Outpatient Medications on File  Prior to Visit  ?Medication Sig Dispense Refill  ? amLODipine (NORVASC) 10 MG tablet Take 1 tablet (10 mg total) by mouth daily. For blood pressure. 90 tablet 3  ? atorvastatin (LIPITOR) 40 MG tablet Take 1 tablet (40 mg total) by mouth at bedtime. For cholesterol 90 tablet 3  ? ?No current facility-administered medications on file prior to visit.  ? ? ?BP 136/80   Pulse 83   Ht 5\' 5"  (1.651 m)   Wt 237 lb (107.5 kg)   SpO2 96%   BMI 39.44 kg/m?  ?Objective:  ? Physical Exam ?HENT:  ?   Right Ear: Tympanic membrane and ear canal normal.  ?   Left Ear: Tympanic membrane and ear canal normal.  ?   Nose: Nose normal.  ?Eyes:  ?   Conjunctiva/sclera: Conjunctivae normal.  ?   Pupils: Pupils are equal, round, and reactive to light.  ?Neck:  ?   Thyroid: No thyromegaly.  ?Cardiovascular:  ?   Rate and Rhythm: Normal rate and regular rhythm.  ?   Heart sounds: No murmur heard. ?Pulmonary:  ?   Effort: Pulmonary effort is normal.  ?   Breath sounds: Normal breath sounds. No rales.  ?Abdominal:  ?   General: Bowel sounds are normal.  ?   Palpations: Abdomen is soft.  ?   Tenderness: There is abdominal tenderness in the periumbilical area. There is no guarding.  ?Musculoskeletal:     ?   General: Normal range of motion.  ?   Cervical back: Neck supple.  ?Lymphadenopathy:  ?   Cervical: No cervical adenopathy.  ?Skin: ?   General: Skin is warm and dry.  ?   Findings: No rash.  ?Neurological:  ?   Mental Status: She is alert and oriented to person, place, and time.  ?   Cranial Nerves: No cranial nerve deficit.  ?   Deep Tendon Reflexes: Reflexes are normal and symmetric.  ?Psychiatric:     ?   Mood and Affect: Mood normal.  ? ? ? ? ? ?   ?Assessment & Plan:  ? ? ? ? ?This visit occurred during the SARS-CoV-2 public health emergency.  Safety protocols were in place, including screening questions prior to the visit, additional usage of staff PPE, and extensive cleaning of exam room while observing appropriate contact time  as indicated for disinfecting solutions.  ?

## 2022-02-04 ENCOUNTER — Encounter: Payer: Self-pay | Admitting: Primary Care

## 2022-02-08 ENCOUNTER — Other Ambulatory Visit: Payer: Self-pay | Admitting: Primary Care

## 2022-02-08 DIAGNOSIS — I10 Essential (primary) hypertension: Secondary | ICD-10-CM

## 2022-03-01 IMAGING — DX DG HAND COMPLETE 3+V*L*
3 series · 3 of 3 positions shown · non-contrast
Comparison: None.

CLINICAL DATA: Left hand pain after injury.

EXAM:
LEFT HAND - COMPLETE 3+ VIEW

[hand pa]
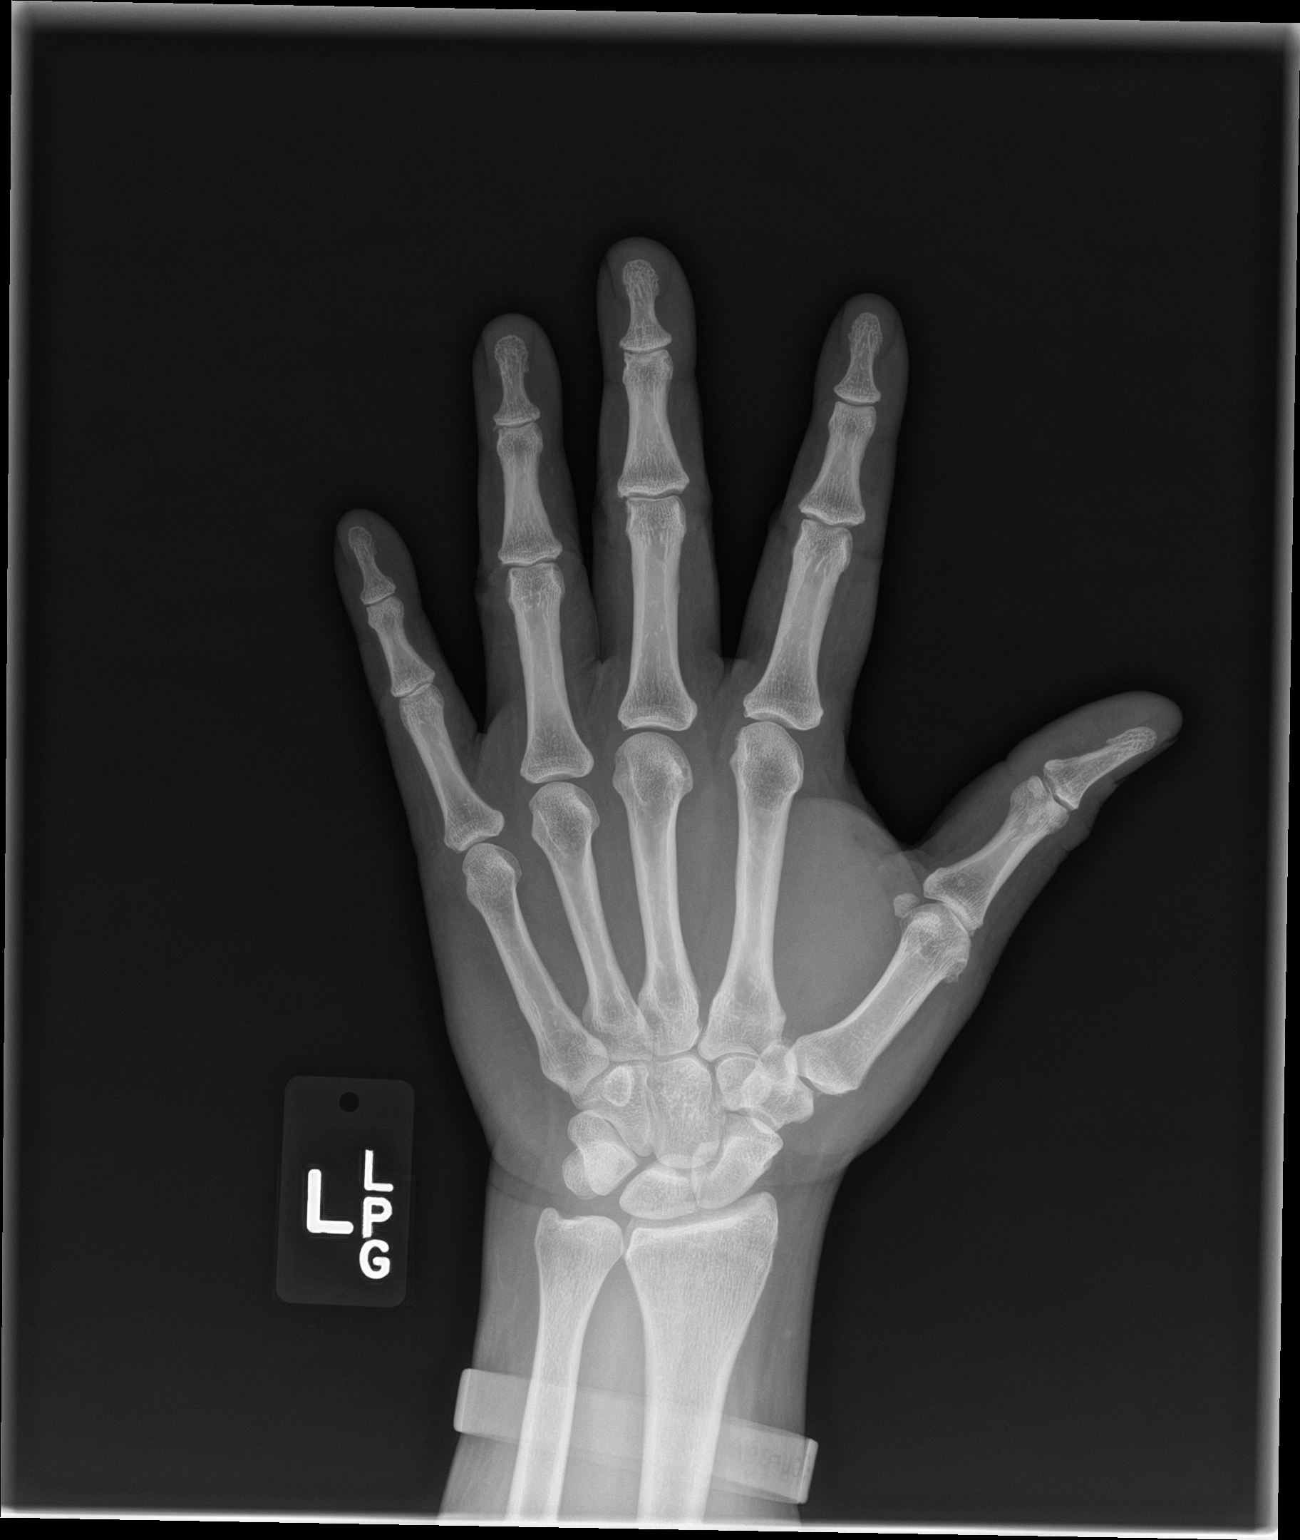

[hand obl]
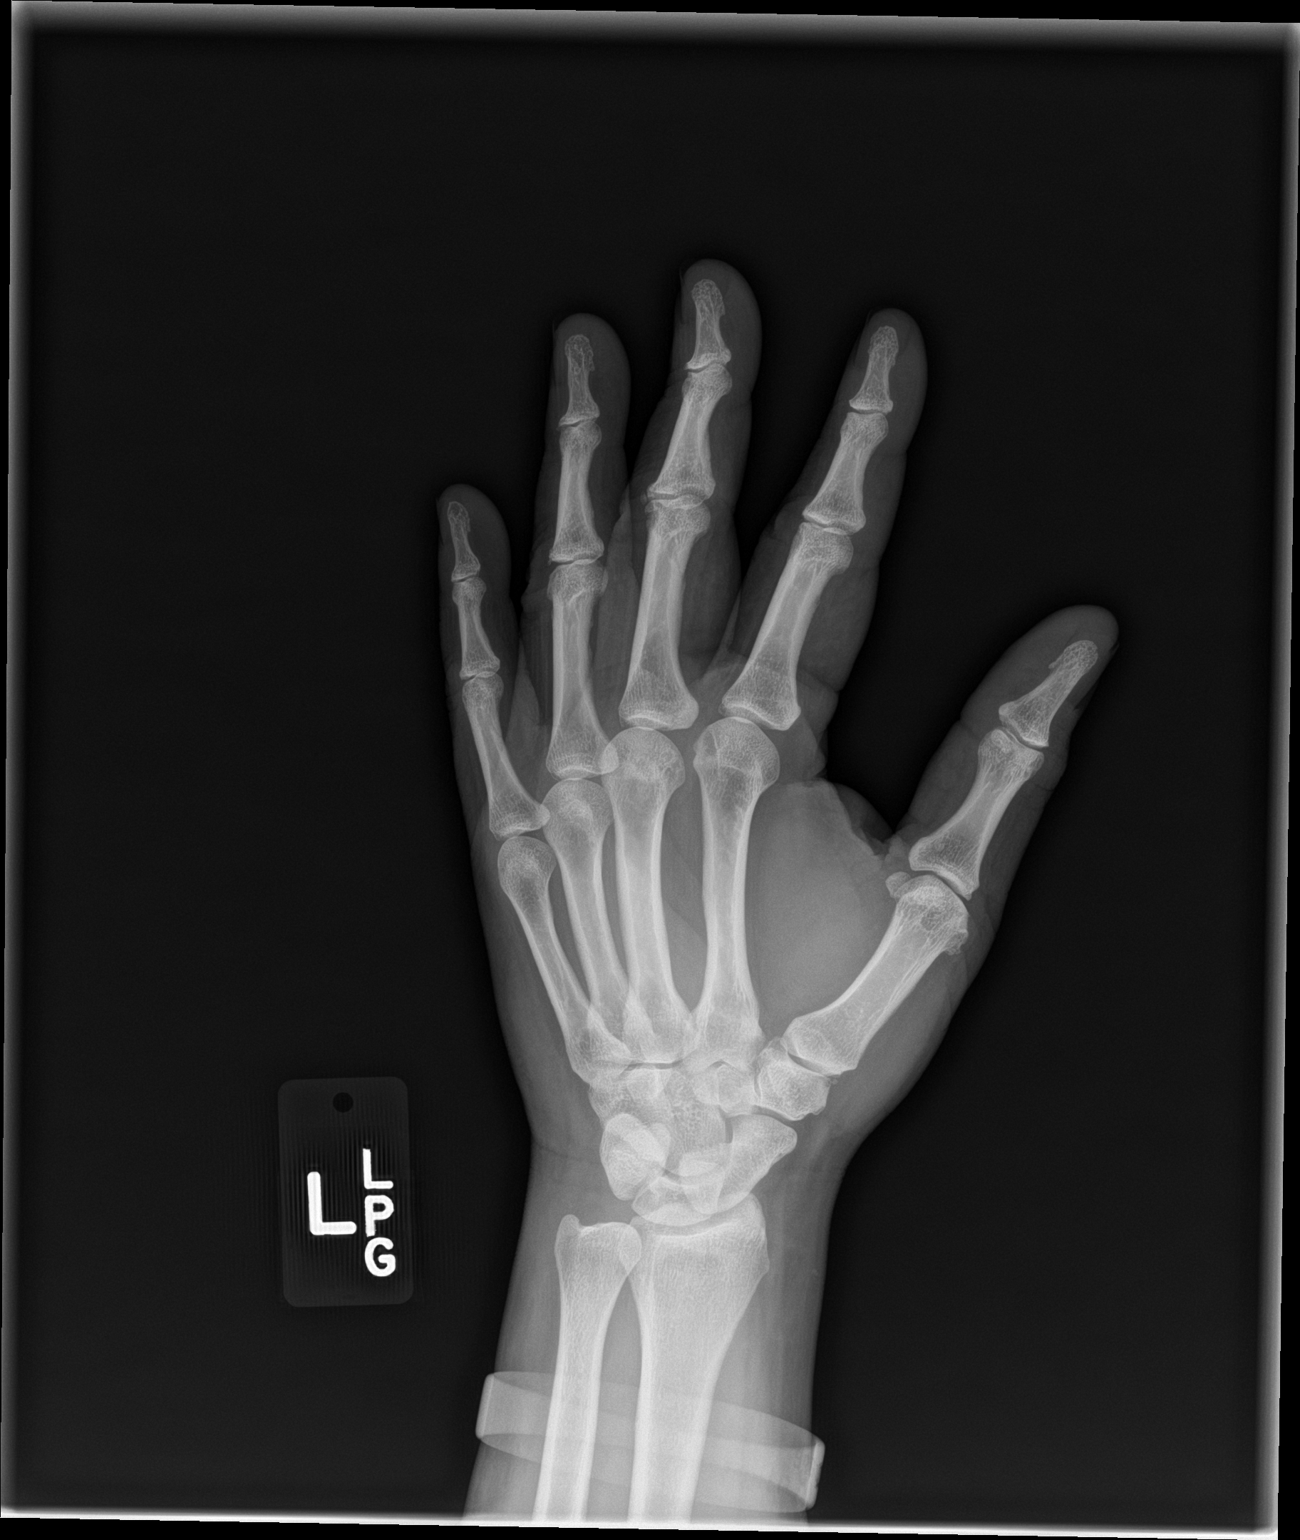

[hand lat]
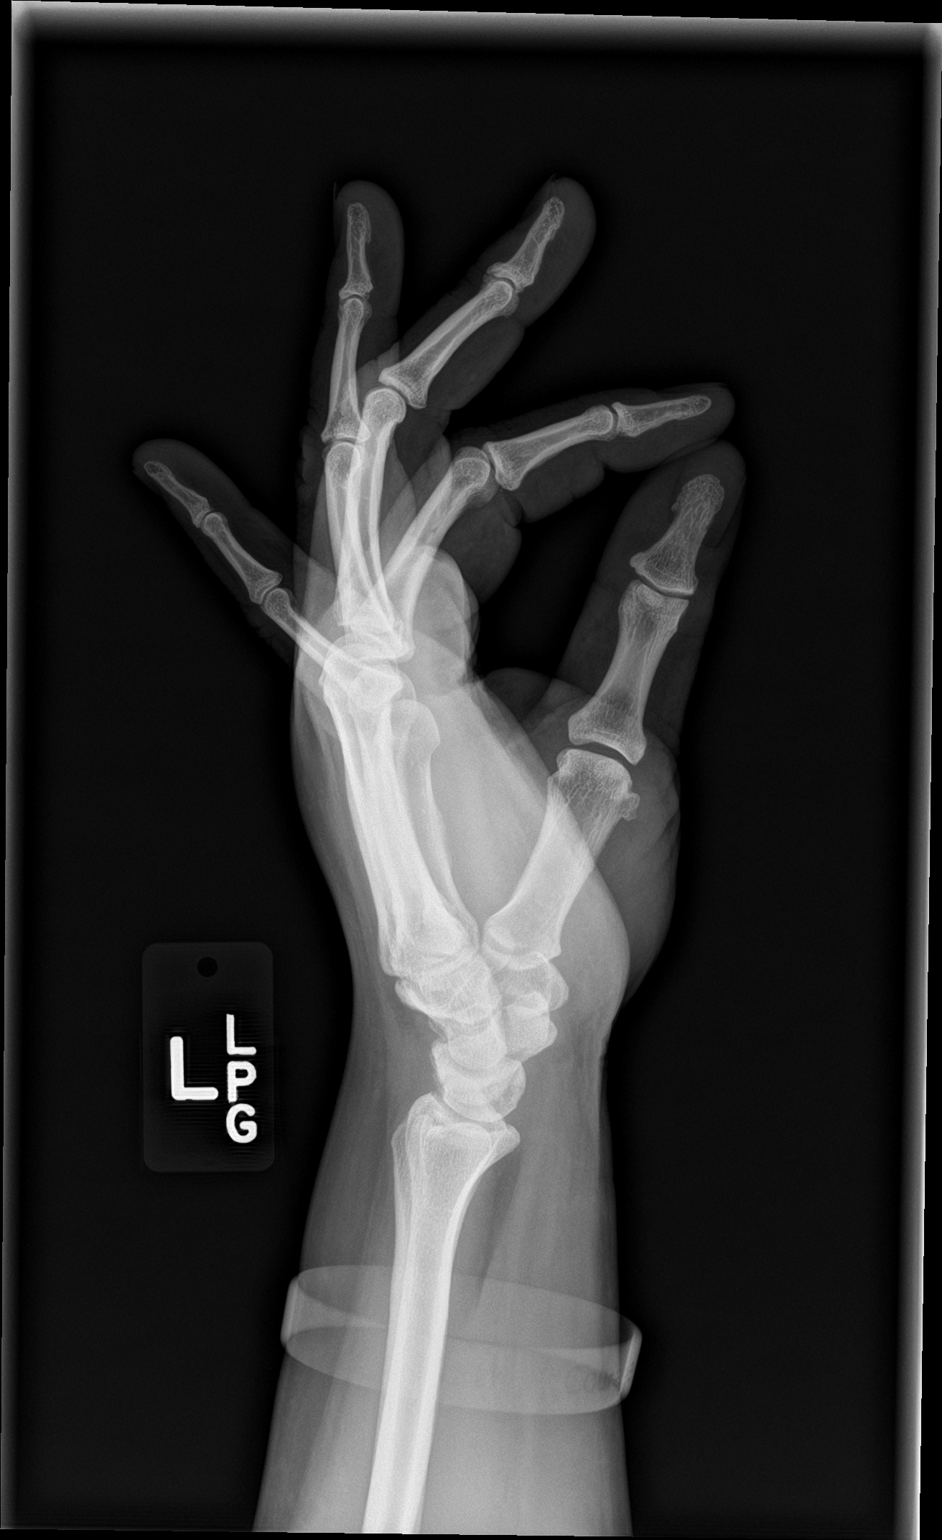

[3 of 3 positions shown; findings below may reference images not displayed]

FINDINGS: There is no evidence of fracture or dislocation. There is no
evidence of arthropathy or other focal bone abnormality. Soft
tissues are unremarkable.
IMPRESSION: Negative.

## 2022-03-04 ENCOUNTER — Other Ambulatory Visit: Payer: Self-pay | Admitting: Primary Care

## 2022-03-04 DIAGNOSIS — F330 Major depressive disorder, recurrent, mild: Secondary | ICD-10-CM

## 2022-04-23 ENCOUNTER — Other Ambulatory Visit: Payer: Self-pay | Admitting: Primary Care

## 2022-04-23 ENCOUNTER — Other Ambulatory Visit (INDEPENDENT_AMBULATORY_CARE_PROVIDER_SITE_OTHER): Payer: 59

## 2022-04-23 DIAGNOSIS — E785 Hyperlipidemia, unspecified: Secondary | ICD-10-CM

## 2022-04-23 LAB — LIPID PANEL
Cholesterol: 225 mg/dL — ABNORMAL HIGH (ref 0–200)
HDL: 34.8 mg/dL — ABNORMAL LOW (ref >39.00)
LDL Cholesterol: 153 mg/dL — ABNORMAL HIGH (ref 0–99)
NonHDL: 190.1
Total CHOL/HDL Ratio: 6
Triglycerides: 186 mg/dL — ABNORMAL HIGH (ref 0.0–149.0)
VLDL: 37.2 mg/dL (ref 0.0–40.0)

## 2022-04-23 LAB — HEPATIC FUNCTION PANEL
ALT: 16 U/L (ref 0–35)
AST: 14 U/L (ref 0–37)
Albumin: 3.8 g/dL (ref 3.5–5.2)
Alkaline Phosphatase: 95 U/L (ref 39–117)
Bilirubin, Direct: 0.1 mg/dL (ref 0.0–0.3)
Total Bilirubin: 0.4 mg/dL (ref 0.2–1.2)
Total Protein: 7.5 g/dL (ref 6.0–8.3)

## 2022-04-25 MED ORDER — ROSUVASTATIN CALCIUM 10 MG PO TABS: 10.0000 mg | ORAL_TABLET | Freq: Every day | ORAL | 0 refills | Status: DC

## 2022-05-18 ENCOUNTER — Ambulatory Visit (INDEPENDENT_AMBULATORY_CARE_PROVIDER_SITE_OTHER): Payer: 59 | Admitting: Clinical

## 2022-05-18 DIAGNOSIS — F331 Major depressive disorder, recurrent, moderate: Secondary | ICD-10-CM

## 2022-05-18 NOTE — Progress Notes (Signed)
Time: 10:00am-10:55am Diagnosis Code: F33.2 Cpt Code: 12458K-99  Barbara Vasquez was seen remotely using secure video conferencing. She was in her home and therapist was in her office at time of session.  Intake Presenting Problem Barbara Vasquez shared that she is seeking therapy due to several significant life changes. She had recently started working again after taking a year off due to suffering an injury in March 2021 while working at her former job. She has had two surgeries to address injuries to her hand and wrist, and she was sent to do light weight work. She cannot lift anything weighing over 3lbs with her left hand, and she is left handed. 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 She began her job at [employer] in June of 2023. She reported feeling periodically sad and upset, but has not had thoughts of harm to herself or others.  Symptoms Depressed mood, bouts of unexplained tearfulness, thoughts of non-suicidal self harm, changes in eating habits including late-night eating. She has never acted on these thoughts, and has not had them for 15 years.  History of Problem  Barbara Vasquez reported that "everything was fine" until her accident until 2021.  Recent Trigger Barbara Vasquez opted to return to therapy when she noticed symptoms of depression returning. She has previously participated in therapy in 2022.   Marital and Family Information  Barbara Vasquez's 23 year old daughter and her 6 children with ages ranging to 4 to 37 have been living with her for a few weeks, with imminent plans to move out. Barbara Vasquez has also lived with her for nearly two years. Barbara Vasquez described her current living situation as "ok," but indicated she could use more space. Barbara Vasquez has a son who lives in [place], who has one child, aged 50.  Present family concerns/problems: None.  Strengths/resources in the  family/friends:   Barbara Vasquez's main source of social support is her girlfriend of 3 years. She described the relationship as "decent," and indicated that they have been learning how to handle her girlfriend's bipolar disorder in their relationship. Her girlfriend has indicated that she would like for Cariah to communicate more, and Barbara Vasquez agrees that communication has always been challenging for her. Her girlfriend lives in [place].  Marital/sexual history patterns: Kareema was married from 1989 to June of 1990, when her daughter was an infant. The relationship ended when her husband cheated.   Family of Origin: Barbara Vasquez described her childhood as "ok," but "not the best." She indicated that she was never close to her Vasquez and was closer to her grandmother and aunt, both of whom have passed away. She has never been close to her father's family. In recent years she became closer to her father, and she has talked to him regularly for the past few years. She indicated that her father's side of the family has treated her differently for being lighter skinned than her siblings. As a child, she had to babysit and care for her siblings from a young age.   Problems in family of origin: None. Family background / ethnic factors: Barbara Vasquez described that she is of mixed background, including 2 different types of Native [place], and black.   No needs/concerns related to ethnicity reported when asked: No  Education/Vocation  Interpersonal concerns/problems: Barbara Vasquez endorsed a desire to withdraw socially from others, as well as a lack of confidence and self criticism. In the past, she has felt  that she is not good enough to be with anyone.  Personal strengths: Barbara Vasquez described her grandchildren as a major source of strength. She also enjoys making bracelets and homemade air fresheners.   Military/work problems/concerns: None Leisure Activities/Daily Functioning: Barbara Vasquez has gone  through periods where she has played video games and been on social media to the detriment of day-to-day tasks. This has not been an issue recently.  Legal Status:  No Legal Problems Medical/Nutritional Concerns  Comments: Barbara Vasquez has undergone several surgeries to address a significant injury to her wrist in March of 2021. She has an appointment with a GI doctor to better understand GI bloating that has continued despite weight loss.  Substance use/abuse/dependence: Barbara Vasquez occasionally drinks a glass of wine (every few weeks). No other substance use endorsed.   Comments: Following verbal review of consents, Barbara Vasquez engaged in creation of an intake and treatment plan for therapy.  Religion/Spirituality: Barbara Vasquez described herself as [place].  General Behavior: good Attire: WNL Gait: not observed Motor Activity: WNL Stream of Thought - Productivity: WNL Stream of thought - Progression: WNL Stream of thought - Language:  WNL Emotional tone and reactions - Mood: periodically tearful  Emotional tone and reactions - Affect: WNL  Mental trend/Content of thoughts - Perception: WNL Mental trend/Content of thoughts - WNL Orientation: WNL Mental trend/Content of thoughts - Memory: WNL Mental trend/Content of thoughts - General knowledge: WNL  Insight: WNL  Judgment: WNL Intelligence: WNL Mental Status Comment: WNL  Diagnostic Summary F33.2    Treatment Plan Client Abilities/Strengths  Barbara Vasquez described herself as motivated to make positive changes in her life, and has found it easy to open up in therapy in the past.  Client Treatment Preferences: Barbara Vasquez prefers virtual appointments.  Client Statement of Needs: Barbara Vasquez would like to improve her overall mood and become more comfortable opening up to trusted others.  Treatment Level  Monthly   Symptoms  Tearfulness, depressed mood, poor self esteem, appetite irregularity Problems Addressed  Barbara Vasquez presented with  symptoms of a depressive episode and has difficulty communicating about emotionally challenging topics. Goals 1. Barbara Vasquez would like to improve her overall mood Objective  Target Date: 05/19/2023 Frequency: Monthly  Progress: 0 Modality:  Individual therapy  Objective 2. Kynlie would like to increase her comfort and skill in discussing emotional topics.  Target Date: 05/19/2023 Frequency: Monthls  Progress: 0 Modality: individual  Related Interventions Dwayna will be provided with opportunities to process her experiences in session Therapist will help Kamaree to notice and disengage from maladaptive thoughts and behaviors using CBT-based strategies Therapist will incorporate behavioral activation as appropriate, including incorporation of structure, mastery experiences, and pleasant events into Kadajah's day-to-day routine. Therapist will introduce emotion regulation strategies, including meditation, mindfulness, and acts of self-care. Therapist will provide referrals for additional resources as appropriate. Diagnosis Axis none Major depression, Recurrent, Moderate (F33.1)   Axis none    Conditions For Discharge Achievement of treatment goals and objectives Therapist Signature ___________________________ Patient Signature ___________________________            [name], PhD

## 2022-05-27 ENCOUNTER — Other Ambulatory Visit: Payer: Self-pay

## 2022-05-27 ENCOUNTER — Ambulatory Visit (INDEPENDENT_AMBULATORY_CARE_PROVIDER_SITE_OTHER): Payer: 59 | Admitting: Gastroenterology

## 2022-05-27 ENCOUNTER — Encounter: Payer: Self-pay | Admitting: Gastroenterology

## 2022-05-27 VITALS — BP 158/108 | HR 93 | Temp 98.7°F | Ht 65.5 in | Wt 230.8 lb

## 2022-05-27 DIAGNOSIS — M549 Dorsalgia, unspecified: Secondary | ICD-10-CM | POA: Diagnosis not present

## 2022-05-27 DIAGNOSIS — K9089 Other intestinal malabsorption: Secondary | ICD-10-CM | POA: Diagnosis not present

## 2022-05-27 MED ORDER — CHOLESTYRAMINE 4 GM/DOSE PO POWD: 4.0000 g | Freq: Two times a day (BID) | ORAL | 6 refills | Status: DC

## 2022-05-27 NOTE — Progress Notes (Signed)
Wyline Mood MD, MRCP(U.K) [address]  [address]  [place], [place] [address]  Main: [phone]  Fax: [phone]   Gastroenterology Consultation  Referring Provider:     Doreene Nest, NP Primary Care Physician:  Doreene Nest, NP Primary Gastroenterologist:  Dr. Wyline Mood  Reason for Consultation:     Abdominal pain         HPI:   Barbara Vasquez is a 51 y.o. y/o female referred for consultation & management  by  Doreene Nest, NP.  She was referred in April 2023.  She says she is here to see me for abdominal pain points to her left lower quadrant going on for over a year she states the pain begins in her left side of her lower back and radiates to the front she states that she has had a bad disc in her back.  The pain is not related to food intake not relieved with a bowel movement.  Denies any NSAID use.  Does not have a her.  At this point of time.  Movement and posture make the pain worse.  She also complains of loose stools since she has had her cholecystectomy.  Has to rush to the restroom right after meal.   01/07/2021 CT abdomen pelvis without contrast for left flank pain showed no abnormalities. 01/31/2022 x-ray abdomen showed no abnormalities. 12/04/2020: Colonoscopy: Normal. 04/23/2022: Hepatic function, LFTs, CBC in March 2023 were normal.  Past Medical History:  Diagnosis Date   Essential hypertension    Flank pain 01/01/2021   Heart murmur    as child   Hypercholesteremia     Past Surgical History:  Procedure Laterality Date   ABDOMINAL HYSTERECTOMY     CHOLECYSTECTOMY     COLONOSCOPY WITH PROPOFOL N/A 12/04/2020   Procedure: COLONOSCOPY WITH PROPOFOL;  Surgeon: Toney Reil, MD;  Location: [facility] ENDOSCOPY;  Service: Gastroenterology;  Laterality: N/A;   HAND TENDON SURGERY Left 04/01/2020   WRIST ARTHROSCOPY Left 12/11/2019   with  TFC debridment, ulanr shortening osteotomy    Prior to Admission medications   Medication Sig  Start Date End Date Taking? Authorizing Provider  amLODipine (NORVASC) 10 MG tablet Take 1 tablet by mouth once daily for blood pressure 02/08/22   Doreene Nest, NP  gabapentin (NEURONTIN) 300 MG capsule Take 1 capsule by mouth at bedtime.    [provider]  naproxen (NAPROSYN) 500 MG tablet Take 1 tablet by mouth 2 (two) times daily with a meal.    [provider]  potassium chloride SA (KLOR-CON M) 20 MEQ tablet Take 1 tablet (20 mEq total) by mouth 2 (two) times daily. For low potassium. 01/29/22   Doreene Nest, NP  rosuvastatin (CRESTOR) 10 MG tablet Take 1 tablet (10 mg total) by mouth daily. for cholesterol. 04/25/22   Doreene Nest, NP  sertraline (ZOLOFT) 50 MG tablet Take 1 tablet by mouth daily.    [provider]    Family History  Problem Relation Age of Onset   Hypertension Father    Hypertension Sister    Diabetes Maternal Aunt    Breast cancer Paternal Grandmother 47     Social History   Tobacco Use   Smoking status: Never   Smokeless tobacco: Never  Vaping Use   Vaping Use: Never used  Substance Use Topics   Alcohol use: Yes    Comment: occasional   Drug use: No    Allergies as of 05/27/2022 -  Review Complete 05/27/2022  Allergen Reaction Noted   Penicillins Swelling, Hives, and Other (See Comments) 11/03/2015    Review of Systems:    All systems reviewed and negative except where noted in HPI.   Physical Exam:  BP (!) 158/108   Pulse 93   Temp 98.7 F (37.1 C) (Oral)   Ht 5' 5.5" (1.664 m)   Wt 230 lb 12.8 oz (104.7 kg)   BMI 37.82 kg/m  No LMP recorded. Patient has had a hysterectomy. Psych:  Alert and cooperative. Normal mood and affect. General:   Alert,  Well-developed, well-nourished, pleasant and cooperative in NAD Head:  Normocephalic and atraumatic. Eyes:  Sclera clear, no icterus.   Conjunctiva pink. Spinal examination tenderness over the midpoint of the lower lumbar spinous processes and  tenderness of the left lower lumbar paraspinal muscles which replicates the pain that she complains of. Neurologic:  Alert and oriented x3;  grossly normal neurologically. Psych:  Alert and cooperative. Normal mood and affect.  Imaging Studies: No results found.  Assessment and Plan:   Barbara Vasquez is a 51 y.o. y/o female has been referred for abdominal pain.  Her history and physical examination with left paraspinal tenderness, tenderness over the lower lumbosacral spines are indicative of musculoskeletal pain.  It is reassuring to note her CT scan, x-ray, colonoscopy performed over the past 1 year has been normal.  She states she is known to have bad spine discs in the past.  I explained to her that she would benefit from physical therapy weight loss and exercise to strengthen her back muscles.  She should follow-up with her primary care provider.  Her loose stools is very likely secondary to bile salt mediated diarrhea related to her cholecystectomy.  I have suggested we start her on Questran 1 packet will be prescribed twice a day.  She can take it as needed and at a dose titrated to the stool consistency that is desired.  Follow up in as needed  Dr Wyline Mood MD,MRCP(U.K)

## 2022-06-11 ENCOUNTER — Ambulatory Visit (INDEPENDENT_AMBULATORY_CARE_PROVIDER_SITE_OTHER): Payer: 59 | Admitting: Primary Care

## 2022-06-11 ENCOUNTER — Encounter: Payer: Self-pay | Admitting: Primary Care

## 2022-06-11 VITALS — BP 134/86 | HR 99 | Temp 98.6°F | Ht 65.5 in | Wt 225.0 lb

## 2022-06-11 DIAGNOSIS — R1032 Left lower quadrant pain: Secondary | ICD-10-CM

## 2022-06-11 DIAGNOSIS — I10 Essential (primary) hypertension: Secondary | ICD-10-CM

## 2022-06-11 DIAGNOSIS — E785 Hyperlipidemia, unspecified: Secondary | ICD-10-CM

## 2022-06-11 LAB — BASIC METABOLIC PANEL
BUN: 17 mg/dL (ref 6–23)
CO2: 27 mEq/L (ref 19–32)
Calcium: 9.2 mg/dL (ref 8.4–10.5)
Chloride: 103 mEq/L (ref 96–112)
Creatinine, Ser: 1.01 mg/dL (ref 0.40–1.20)
GFR: 64.1 mL/min (ref >60.00)
Glucose, Bld: 95 mg/dL (ref 70–99)
Potassium: 3.8 mEq/L (ref 3.5–5.1)
Sodium: 137 mEq/L (ref 135–145)

## 2022-06-11 LAB — LIPID PANEL
Cholesterol: 257 mg/dL — ABNORMAL HIGH (ref 0–200)
HDL: 35.2 mg/dL — ABNORMAL LOW (ref >39.00)
LDL Cholesterol: 191 mg/dL — ABNORMAL HIGH (ref 0–99)
NonHDL: 221.8
Total CHOL/HDL Ratio: 7
Triglycerides: 152 mg/dL — ABNORMAL HIGH (ref 0.0–149.0)
VLDL: 30.4 mg/dL (ref 0.0–40.0)

## 2022-06-11 NOTE — Assessment & Plan Note (Signed)
Continue rosuvastatin 10 mg daily. ?Repeat lipid panel pending. ?

## 2022-06-11 NOTE — Patient Instructions (Signed)
Stop by the lab prior to leaving today. I will notify you of your results once received.   You will be contacted regarding your referral to GI and also for your CT scan.  Please let us know if you have not been contacted within two weeks.   It was a pleasure to see you today!

## 2022-06-11 NOTE — Progress Notes (Signed)
Subjective:    Patient ID: Barbara Vasquez, female    DOB: [birth year], 51 y.o.   MRN: [id number]  Hypertension Pertinent negatives include no chest pain or shortness of breath.    Barbara Vasquez is a very pleasant 51 y.o. female with a history of hypertension, hyperlipidemia, prediabetes, major depressive disorder, chronic bilateral lower back pain, who presents today to discuss abdominal pain and for Urgent Care Follow up. She is also due for repeat lipid panel.   Evaluated by GI on 05/27/2022 for left lower quadrant abdominal pain that began 1 year prior, beginning to the left lower back with radiation to her front. Also with chronic diarrhea since cholecystectomy.  During this visit her symptoms were determined to be secondary to muscular involvement.  Weight loss and physical therapy were proposed treatment options.  She was prescribed Questran packets twice daily for her chronic diarrhea.  It was recommended she follow-up with PCP for evaluation of her back pain. Her BP was noted to be elevated.  She presented to urgent care through [facility] on 05/27/2022 for elevated blood pressure reading that was noted at her GI doctor's office.  During this visit she was advised to continue her amlodipine 10 mg daily and to start hydrochlorothiazide 12.5 mg on Monday, Wednesday, Friday.  Today she endorses that she doesn't agree with the GI doctor regarding her abdominal pain. She doesn't believe that her abdominal pain is coming from her back. She is requesting a second opinion of her abdominal pain which is located to the LLQ. She experiences her pain each time she eats or drinks. She does experience intermittent nausea without vomiting. Overall her LLQ abdominal pain has progressed and is more frequent. She has not started the Questran packets.   She is compliant to her amlodipine 10 mg daily and HCTZ 12.5 mg on Monday, Wednesday, Friday. She has not taken her HCTZ today.   She denies  bloody stools.   BP Readings from Last 3 Encounters:  06/11/22 134/86  05/27/22 (!) 158/108  01/29/22 136/80      Review of Systems  Constitutional:  Negative for unexpected weight change.  Respiratory:  Negative for shortness of breath.   Cardiovascular:  Negative for chest pain.  Gastrointestinal:  Positive for abdominal pain, diarrhea and nausea. Negative for blood in stool and vomiting.  Musculoskeletal:  Negative for back pain.         Past Medical History:  Diagnosis Date   Essential hypertension    Flank pain 01/01/2021   Heart murmur    as child   Hypercholesteremia     Social History   Socioeconomic History   Marital status: Single    Spouse name: Not on file   Number of children: Not on file   Years of education: Not on file   Highest education level: Not on file  Occupational History   Not on file  Tobacco Use   Smoking status: Never   Smokeless tobacco: Never  Vaping Use   Vaping Use: Never used  Substance and Sexual Activity   Alcohol use: Yes    Comment: occasional   Drug use: No   Sexual activity: Yes    Birth control/protection: Surgical  Other Topics Concern   Not on file  Social History Narrative   Moved from [place].   2 children.  5 grandchildren.   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   Social Determinants of Health  Financial Resource Strain: Not on file  Food Insecurity: Not on file  Transportation Needs: Not on file  Physical Activity: Not on file  Stress: Not on file  Social Connections: Not on file  Intimate Partner Violence: Not on file    Past Surgical History:  Procedure Laterality Date   ABDOMINAL HYSTERECTOMY     CHOLECYSTECTOMY     COLONOSCOPY WITH PROPOFOL N/A 12/04/2020   Procedure: COLONOSCOPY WITH PROPOFOL;  Surgeon: Toney Reil, MD;  Location: [facility] ENDOSCOPY;  Service: Gastroenterology;  Laterality: N/A;   HAND TENDON SURGERY Left 04/01/2020   WRIST ARTHROSCOPY  Left 12/11/2019   with  TFC debridment, ulanr shortening osteotomy    Family History  Problem Relation Age of Onset   Hypertension Father    Hypertension Sister    Diabetes Maternal Aunt    Breast cancer Paternal Grandmother 72    Allergies  Allergen Reactions   Penicillins Swelling, Hives and Other (See Comments)    Swelling of the throat Swelling of the throat, chest pain    Current Outpatient Medications on File Prior to Visit  Medication Sig Dispense Refill   amLODipine (NORVASC) 10 MG tablet Take 1 tablet by mouth once daily for blood pressure 90 tablet 3   cholestyramine (QUESTRAN) 4 GM/DOSE powder Take 1 packet (4 g total) by mouth 2 (two) times daily with a meal. 120 g 6   gabapentin (NEURONTIN) 300 MG capsule Take 1 capsule by mouth at bedtime.     hydrochlorothiazide (HYDRODIURIL) 12.5 MG tablet Take 12.5 mg by mouth 3 (three) times a week. M,W,F     rosuvastatin (CRESTOR) 10 MG tablet Take 1 tablet (10 mg total) by mouth daily. for cholesterol. 90 tablet 0   sertraline (ZOLOFT) 50 MG tablet Take 1 tablet by mouth daily.     No current facility-administered medications on file prior to visit.    BP 134/86   Pulse 99   Temp 98.6 F (37 C) (Oral)   Ht 5' 5.5" (1.664 m)   Wt 225 lb (102.1 kg)   SpO2 99%   BMI 36.87 kg/m  Objective:   Physical Exam Cardiovascular:     Rate and Rhythm: Normal rate and regular rhythm.  Pulmonary:     Effort: Pulmonary effort is normal.     Breath sounds: Normal breath sounds.  Abdominal:     General: Bowel sounds are normal.     Palpations: Abdomen is soft.     Tenderness: There is abdominal tenderness in the left lower quadrant. There is no guarding.  Musculoskeletal:     Cervical back: Neck supple.  Skin:    General: Skin is warm and dry.           Assessment & Plan:   Problem List Items Addressed This Visit       Cardiovascular and Mediastinum   Essential hypertension    Improved, has not taken HCTZ  today.  Continue amlodipine 10 mg daily, HCTZ on Monday, Wednesday, Friday. BMP pending.      Relevant Medications   hydrochlorothiazide (HYDRODIURIL) 12.5 MG tablet   Other Relevant Orders   Basic metabolic panel     Other   Hyperlipidemia    Continue rosuvastatin 10 mg daily. Repeat lipid panel pending.      Relevant Medications   hydrochlorothiazide (HYDRODIURIL) 12.5 MG tablet   Other Relevant Orders   Lipid panel   Left lower quadrant abdominal pain - Primary    Reviewed GI notes from July  2023.  Given her progressive pain we will obtain CT abdomen/pelvis for a better look. Referral placed to GI for second opinion.  She is stable today. ED precautions provided.       Relevant Orders   CT Abdomen Pelvis W Contrast   Ambulatory referral to Gastroenterology       Pleas Koch, NP

## 2022-06-11 NOTE — Assessment & Plan Note (Signed)
Improved, has not taken HCTZ today.  Continue amlodipine 10 mg daily, HCTZ on Monday, Wednesday, Friday. BMP pending.

## 2022-06-11 NOTE — Assessment & Plan Note (Signed)
Reviewed GI notes from July 2023.  Given her progressive pain we will obtain CT abdomen/pelvis for a better look. Referral placed to GI for second opinion.  She is stable today. ED precautions provided.

## 2022-06-13 DIAGNOSIS — E785 Hyperlipidemia, unspecified: Secondary | ICD-10-CM

## 2022-06-14 ENCOUNTER — Telehealth: Payer: Self-pay | Admitting: Gastroenterology

## 2022-06-14 NOTE — Telephone Encounter (Signed)
Received a referral to schedule patient. Patient was just seen by Dr Tobi Bastos on 05/27/2022 but states she wants a "second opinion from a different doctor" and "wants a doctor that is going to sit and listen to her." I spoke to the patient and told her that I would send a message requesting a transfer within the office and we will let her know.   Send to Dr Servando Snare - per Ginger F

## 2022-06-17 MED ORDER — ROSUVASTATIN CALCIUM 20 MG PO TABS: 20.0000 mg | ORAL_TABLET | Freq: Every day | ORAL | 3 refills | Status: DC

## 2022-06-23 ENCOUNTER — Ambulatory Visit (INDEPENDENT_AMBULATORY_CARE_PROVIDER_SITE_OTHER): Payer: 59 | Admitting: Clinical

## 2022-06-23 DIAGNOSIS — F331 Major depressive disorder, recurrent, moderate: Secondary | ICD-10-CM | POA: Diagnosis not present

## 2022-06-23 NOTE — Progress Notes (Signed)
Time: 9:04 am-9:53am Diagnosis Code: F33.1 Cpt Code: 23361Q-24  Barbara Vasquez was seen remotely using secure video conferencing. She was in her home and therapist was in her office at time of session. She shared that she had had a falling out with her mother, daughter, and sister since her last session. She reflected upon dynamics in her family, including during her childhood, and shared that she had taken on a load of responsibilities that were too much for her at the time, including all the family laundry from the age of 58 and cooking family meals from the age of 39. She reported ongoing resentment toward her mother as a result. For homework, she will notice when childhood experiences impact her current relationships, to discuss in her next session. She is scheduled to be seen again in one month.    Treatment Plan Client Abilities/Strengths  [name] described herself as motivated to make positive changes in her life, and has found it easy to open up in therapy in the past.  Client Treatment Preferences: [name] prefers virtual appointments.  Client Statement of Needs: [name] would like to improve her overall mood and become more comfortable opening up to trusted others.  Treatment Level  Monthly   Symptoms  Tearfulness, depressed mood, poor self esteem, appetite irregularity Problems Addressed  [name] presented with symptoms of a depressive episode and has difficulty communicating about emotionally challenging topics. Goals 1. [name] would like to improve her overall mood Objective  Target Date: 05/19/2023 Frequency: Monthly  Progress: 0 Modality:  Individual therapy  Objective 2. [name] would like to increase her comfort and skill in discussing emotional topics.  Target Date: 05/19/2023 Frequency: Monthls  Progress: 0 Modality: individual  Related Interventions [name] will be provided with opportunities to process her experiences in session Therapist will help  [name] to notice and disengage from maladaptive thoughts and behaviors using CBT-based strategies Therapist will incorporate behavioral activation as appropriate, including incorporation of structure, mastery experiences, and pleasant events into [name]'s day-to-day routine. Therapist will introduce emotion regulation strategies, including meditation, mindfulness, and acts of self-care. Therapist will provide referrals for additional resources as appropriate. Diagnosis Axis none Major depression, Recurrent, Moderate (F33.1)   Axis none    Conditions For Discharge Achievement of treatment goals and objectives Therapist Signature ___________________________ Patient Signature ___________________________            [name], PhD               [name], PhD

## 2022-06-29 ENCOUNTER — Telehealth: Payer: Self-pay | Admitting: Primary Care

## 2022-06-29 ENCOUNTER — Ambulatory Visit: Admission: RE | Admit: 2022-06-29 | Payer: Commercial Managed Care - HMO | Source: Ambulatory Visit

## 2022-06-29 DIAGNOSIS — R1032 Left lower quadrant pain: Secondary | ICD-10-CM

## 2022-06-29 NOTE — Telephone Encounter (Signed)
Spoke to patient I have sent my chart with information as requested. She will give [place] a call with the location.

## 2022-06-29 NOTE — Telephone Encounter (Signed)
Patient called in and stated that the closest place for her to get a CT done with her insurance is about 2-3 hrs away. She also stated that her insurance didn't receive a request for the CT. She would like for someone to give her a callback when able to. Thank you!

## 2022-06-29 NOTE — Telephone Encounter (Signed)
Called patient she received list of places but over 2 hrs away. She will call back and see if [facility] is option. Will call back at after she finds out

## 2022-06-29 NOTE — Addendum Note (Signed)
Addended by: Doreene Nest on: 06/29/2022 04:55 PM   Modules accepted: Orders

## 2022-06-29 NOTE — Telephone Encounter (Signed)
Please call patient:   I spoke with a representative of [facility] for a "peer to peer" review, and they won't accept the CT scan because of the location, even though it's outpatient.   She needs to call her insurance company to find out which outpatient imaging locations her insurance will accept. Once she has chosen a location then she needs to notify us and also call [phone] [facility]) to notify them. The scan should be approved.  I will be out of the office starting tomorrow, so either she calls now and finds out so that I can place a new CT scan order, or this will have to wait until I'm back on 09/18.

## 2022-06-29 NOTE — Telephone Encounter (Signed)
Noted.  New order for CT abdomen pelvis placed for [facility].  She now needs to call [facility] at the number listed in the initial phone note below.

## 2022-06-29 NOTE — Telephone Encounter (Signed)
Pt called back & stated that her insurance said it could be done at [facility] [address], [place], medical mall entrance and radiology dept. Pt stated they needed a procedure code to authorize the ct scan.

## 2022-06-30 NOTE — Telephone Encounter (Signed)
Spoke with patient and she states that she called her insurance they advised her that [facility] is in Network with her insurance and this is where she needs to have this CT performed.   I was able to speak with someone to get site override and this was approved  Auth# [id number] Exp 12/27/2022  Pt is aware.

## 2022-06-30 NOTE — Telephone Encounter (Signed)
Noted.  Pt scheduled for 07/07/2022 at [facility]

## 2022-07-07 ENCOUNTER — Ambulatory Visit
Admission: RE | Admit: 2022-07-07 | Discharge: 2022-07-07 | Disposition: A | Discharge status: Home / Self Care | Payer: Commercial Managed Care - HMO | Source: Ambulatory Visit | Attending: Primary Care | Admitting: Primary Care

## 2022-07-07 DIAGNOSIS — R1032 Left lower quadrant pain: Secondary | ICD-10-CM | POA: Insufficient documentation

## 2022-07-07 MED ORDER — IOHEXOL 300 MG/ML  SOLN
100.0000 mL | Freq: Once | INTRAMUSCULAR | Status: AC | PRN
  Administered 2022-07-07: 100 mL via INTRAVENOUS

## 2022-07-21 ENCOUNTER — Ambulatory Visit: Payer: 59 | Admitting: Clinical

## 2022-10-14 ENCOUNTER — Encounter: Payer: Self-pay | Admitting: Gastroenterology

## 2022-10-14 ENCOUNTER — Ambulatory Visit (INDEPENDENT_AMBULATORY_CARE_PROVIDER_SITE_OTHER): Payer: Commercial Managed Care - HMO | Admitting: Gastroenterology

## 2022-10-14 VITALS — BP 152/98 | HR 106 | Temp 98.1°F | Ht 65.0 in | Wt 214.0 lb

## 2022-10-14 DIAGNOSIS — K580 Irritable bowel syndrome with diarrhea: Secondary | ICD-10-CM

## 2022-10-14 DIAGNOSIS — R1032 Left lower quadrant pain: Secondary | ICD-10-CM

## 2022-10-14 MED ORDER — DICYCLOMINE HCL 20 MG PO TABS: 20.0000 mg | ORAL_TABLET | Freq: Three times a day (TID) | ORAL | 5 refills | Status: DC

## 2022-10-14 NOTE — Progress Notes (Signed)
Primary Care Physician: Barbara Nest, NP  Primary Gastroenterologist:  Dr. Midge Vasquez  Chief Complaint  Patient presents with   New Patient (Initial Visit)   Abdominal Pain    HPI: Barbara Vasquez is a 51 y.o. female here for follow-up after being seen by Dr. Allegra Vasquez for colonoscopy with a normal colonoscopy at that time.  The patient was then seen by Dr. Tobi Vasquez for left lower quadrant pain with his determination that this is musculoskeletal with perispinal tenderness.  The patient was also reporting diarrhea which was thought to be bile salt diarrhea.  The patient had gone to her primary care provider and reported that she did not feel that this was the right diagnosis despite the normal colonoscopy and also the patient had a CT scan.  The patient CT scan showed:  IMPRESSION: No CT finding to explain the patient's left lower quadrant pain.  She also reported that the pain had increased when she last saw her PCP in September.  She also stated that her pain was worse after she ate. The patient reports that she went to the bathroom within 30 minutes of the bathroom.  The patient has been explained that the food could not go through so quickly without her having some sort malabsorptive symptoms.  The patient reports that she knows that is absolutely the food she ate 30 minutes prior despite being told that that is highly unlikely.  She also reports that she has not had any unexplained weight loss.  She states that she has pain in her left side of her abdomen after she eats.  She was not happy with the recommendations that she was given by Dr. Tobi Vasquez and decided to switch to see me.  The patient also has chronic back pain and was started on Questran because of her diarrhea.  She states that the Questran did not help and she stopped taking the Questran.  She now reports that the pain in her abdomen is worse with eating and is present while we are sitting in the office today.  She was also  diagnosed by Dr. Tobi Vasquez is having musculoskeletal pain which she states she does not believe is her issue.  Past Medical History:  Diagnosis Date   Essential hypertension    Flank pain 01/01/2021   Heart murmur    as child   Hypercholesteremia     Current Outpatient Medications  Medication Sig Dispense Refill   amLODipine (NORVASC) 10 MG tablet Take 1 tablet by mouth once daily for blood pressure 90 tablet 3   cholestyramine (QUESTRAN) 4 GM/DOSE powder Take 1 packet (4 g total) by mouth 2 (two) times daily with a meal. 120 g 6   dicyclomine (BENTYL) 20 MG tablet Take 1 tablet (20 mg total) by mouth 3 (three) times daily before meals. 90 tablet 5   gabapentin (NEURONTIN) 300 MG capsule Take 1 capsule by mouth at bedtime.     hydrochlorothiazide (HYDRODIURIL) 12.5 MG tablet Take 12.5 mg by mouth 3 (three) times a week. M,W,F     rosuvastatin (CRESTOR) 20 MG tablet Take 1 tablet (20 mg total) by mouth daily. for cholesterol. 90 tablet 3   sertraline (ZOLOFT) 50 MG tablet Take 1 tablet by mouth daily.     No current facility-administered medications for this visit.    Allergies as of 10/14/2022 - Review Complete 10/14/2022  Allergen Reaction Noted   Penicillins Swelling, Hives, and Other (See Comments) 11/03/2015    ROS:  General:  Negative for anorexia, weight loss, fever, chills, fatigue, weakness. ENT: Negative for hoarseness, difficulty swallowing , nasal congestion. CV: Negative for chest pain, angina, palpitations, dyspnea on exertion, peripheral edema.  Respiratory: Negative for dyspnea at rest, dyspnea on exertion, cough, sputum, wheezing.  GI: See history of present illness. GU:  Negative for dysuria, hematuria, urinary incontinence, urinary frequency, nocturnal urination.  Endo: Negative for unusual weight change.    Physical Examination:   BP (!) 152/98 (BP Location: Left Arm, Patient Position: Sitting, Cuff Size: Large)   Pulse (!) 106   Temp 98.1 F (36.7 C) (Oral)    Ht 5\' 5"  (1.651 m)   Wt 214 lb (97.1 kg)   BMI 35.61 kg/m   General: Well-nourished, well-developed in no acute distress.  Eyes: No icterus. Conjunctivae pink. Lungs: Clear to auscultation bilaterally. Non-labored. Heart: Regular rate and rhythm, no murmurs rubs or gallops.  Abdomen: Bowel sounds are normal, nontender, nondistended, no hepatosplenomegaly or masses, no abdominal bruits or hernia , no rebound or guarding.   Extremities: No lower extremity edema. No clubbing or deformities. Neuro: Alert and oriented x 3.  Grossly intact. Skin: Warm and dry, no jaundice.   Psych: Alert and cooperative, normal mood and affect.  Labs:    Imaging Studies: No results found.  Assessment and Plan:   Barbara Vasquez is a 51 y.o. y/o female who comes in today with a history of abdominal pain with bowel movements 30 minutes after eating.  The patient has been reassured that despite being sure that it is the same food she ate it is unlikely so without any weight loss or malnutrition.  The patient has signs and symptoms consistent with irritable bowel syndrome with urgency after eating.  The patient will be started on a low FODMAP diet.  The patient has also reported that her pain is much worse when she has bowel movements that is a cramping pain in the left side.  On physical exam there is a component of musculoskeletal pain that she has been explained about but also component of spasmodic symptoms.  For this the patient will be started on dicyclomine 20 mg 3 times a day in addition to her low FODMAP diet.  The patient has been explained the plan and states that she understands it and agrees with it.  She will let me know if she has any further issues.     43, MD. Barbara Vasquez    Note: This dictation was prepared with Dragon dictation along with smaller phrase technology. Any transcriptional errors that result from this process are unintentional.

## 2022-10-20 ENCOUNTER — Ambulatory Visit: Payer: 59 | Admitting: Gastroenterology

## 2023-03-04 ENCOUNTER — Other Ambulatory Visit: Payer: Self-pay

## 2023-03-04 ENCOUNTER — Emergency Department
Admission: EM | Admit: 2023-03-04 | Discharge: 2023-03-04 | Disposition: A | Discharge status: Home / Self Care | Payer: BC Managed Care – PPO | Attending: Emergency Medicine | Admitting: Emergency Medicine

## 2023-03-04 ENCOUNTER — Emergency Department: Payer: BC Managed Care – PPO

## 2023-03-04 DIAGNOSIS — R944 Abnormal results of kidney function studies: Secondary | ICD-10-CM | POA: Diagnosis not present

## 2023-03-04 DIAGNOSIS — R0789 Other chest pain: Secondary | ICD-10-CM | POA: Diagnosis not present

## 2023-03-04 DIAGNOSIS — I10 Essential (primary) hypertension: Secondary | ICD-10-CM | POA: Insufficient documentation

## 2023-03-04 DIAGNOSIS — R0602 Shortness of breath: Secondary | ICD-10-CM | POA: Insufficient documentation

## 2023-03-04 DIAGNOSIS — R079 Chest pain, unspecified: Secondary | ICD-10-CM | POA: Diagnosis not present

## 2023-03-04 DIAGNOSIS — Z20822 Contact with and (suspected) exposure to covid-19: Secondary | ICD-10-CM | POA: Diagnosis not present

## 2023-03-04 LAB — BASIC METABOLIC PANEL
Anion gap: 9 (ref 5–15)
BUN: 20 mg/dL (ref 6–20)
CO2: 23 mmol/L (ref 22–32)
Calcium: 8.8 mg/dL — ABNORMAL LOW (ref 8.9–10.3)
Chloride: 105 mmol/L (ref 98–111)
Creatinine, Ser: 1.05 mg/dL — ABNORMAL HIGH (ref 0.44–1.00)
GFR, Estimated: >60 mL/min (ref >60)
Glucose, Bld: 126 mg/dL — ABNORMAL HIGH (ref 70–99)
Potassium: 3.7 mmol/L (ref 3.5–5.1)
Sodium: 137 mmol/L (ref 135–145)

## 2023-03-04 LAB — HEPATIC FUNCTION PANEL
ALT: 24 U/L (ref 0–44)
AST: 25 U/L (ref 15–41)
Albumin: 3.9 g/dL (ref 3.5–5.0)
Alkaline Phosphatase: 87 U/L (ref 38–126)
Bilirubin, Direct: <0.1 mg/dL (ref 0.0–0.2)
Total Bilirubin: 0.6 mg/dL (ref 0.3–1.2)
Total Protein: 8.2 g/dL — ABNORMAL HIGH (ref 6.5–8.1)

## 2023-03-04 LAB — CBC
HCT: 38.9 % (ref 36.0–46.0)
Hemoglobin: 13 g/dL (ref 12.0–15.0)
MCH: 31.3 pg (ref 26.0–34.0)
MCHC: 33.4 g/dL (ref 30.0–36.0)
MCV: 93.7 fL (ref 80.0–100.0)
Platelets: 349 10*3/uL (ref 150–400)
RBC: 4.15 MIL/uL (ref 3.87–5.11)
RDW: 12.6 % (ref 11.5–15.5)
WBC: 10.2 10*3/uL (ref 4.0–10.5)
nRBC: 0 % (ref 0.0–0.2)

## 2023-03-04 LAB — SARS CORONAVIRUS 2 BY RT PCR: SARS Coronavirus 2 by RT PCR: NEGATIVE

## 2023-03-04 LAB — LIPASE, BLOOD: Lipase: 28 U/L (ref 11–51)

## 2023-03-04 LAB — TROPONIN I (HIGH SENSITIVITY)
Troponin I (High Sensitivity): 3 ng/L (ref <18)
Troponin I (High Sensitivity): 3 ng/L (ref <18)

## 2023-03-04 MED ORDER — IOHEXOL 350 MG/ML SOLN
75.0000 mL | Freq: Once | INTRAVENOUS | Status: AC | PRN
  Administered 2023-03-04: 75 mL via INTRAVENOUS

## 2023-03-04 MED ORDER — MORPHINE SULFATE (PF) 4 MG/ML IV SOLN
4.0000 mg | Freq: Once | INTRAVENOUS | Status: AC
  Administered 2023-03-04: 4 mg via INTRAVENOUS
  Filled 2023-03-04: qty 1

## 2023-03-04 MED ORDER — ALUM & MAG HYDROXIDE-SIMETH 200-200-20 MG/5ML PO SUSP
30.0000 mL | Freq: Once | ORAL | Status: AC
  Administered 2023-03-04: 30 mL via ORAL
  Filled 2023-03-04: qty 30

## 2023-03-04 MED ORDER — PANTOPRAZOLE SODIUM 20 MG PO TBEC: 20.0000 mg | DELAYED_RELEASE_TABLET | Freq: Every day | ORAL | 0 refills | Status: DC

## 2023-03-04 MED ORDER — ONDANSETRON HCL 4 MG/2ML IJ SOLN
4.0000 mg | Freq: Once | INTRAMUSCULAR | Status: AC
  Administered 2023-03-04: 4 mg via INTRAVENOUS
  Filled 2023-03-04: qty 2

## 2023-03-04 MED ORDER — PANTOPRAZOLE SODIUM 40 MG IV SOLR
40.0000 mg | Freq: Once | INTRAVENOUS | Status: AC
  Administered 2023-03-04: 40 mg via INTRAVENOUS
  Filled 2023-03-04: qty 10

## 2023-03-04 NOTE — Discharge Instructions (Addendum)
Take Tylenol 1 g every 8 hours she can use ibuprofen 400 every 6-8 hours as needed for pain with food, start the acid reducer.  You should return to the ER if you develop worsening chest pain, shortness of breath or any other concerns.  IMPRESSION: There is no evidence of pulmonary artery embolism. There is no evidence of thoracic aortic dissection. There is no focal pulmonary consolidation. There is no pleural effusion or pneumothorax.

## 2023-03-04 NOTE — ED Triage Notes (Signed)
Pt  c/o worsening CP and SOB x3 days. Pt reports hx of heart murmur and hypertension.

## 2023-03-04 NOTE — ED Provider Notes (Signed)
[facility] Provider Note    Event Date/Time   First MD Initiated Contact with Patient 03/04/23 1052     (approximate)   History   Chest Pain and Shortness of Breath   HPI  Barbara Vasquez is a 52 y.o. female with history of hypertension, heart murmur who comes in with concerns for chest pain and shortness of breath over the past 3 days.  Patient reports that she initially just had the chest pain has been ongoing a chest tightness in the middle of her chest, nonradiating, no numbness or tingling but then today she developed some shortness of breath and pain with taking a deep breath.  She denies ever having anything like this previously.  No swelling in her legs.  No calf tenderness.  She reports already having her removed and denies any abdominal pain.  Denies any smoking history.  She does report history of anxiety. Denies any increasing stress/SI.    Physical Exam   Triage Vital Signs: ED Triage Vitals  Enc Vitals Group     BP 03/04/23 1028 (!) 149/101     Pulse Rate 03/04/23 1028 (!) 106     Resp 03/04/23 1028 16     Temp 03/04/23 1028 99.1 F (37.3 C)     Temp Source 03/04/23 1028 Oral     SpO2 03/04/23 1028 98 %     Weight 03/04/23 1015 224 lb (101.6 kg)     Height 03/04/23 1015 5\' 5"  (1.651 m)     Head Circumference --      Peak Flow --      Pain Score 03/04/23 1015 8     Pain Loc --      Pain Edu? --      Excl. in GC? --     Most recent vital signs: Vitals:   03/04/23 1028  BP: (!) 149/101  Pulse: (!) 106  Resp: 16  Temp: 99.1 F (37.3 C)  SpO2: 98%     General: Awake, no distress.  CV:  Good peripheral perfusion.  Resp:  Normal effort.  Clear lungs Abd:  No distention.  Other:  No swelling in legs.  No calf tenderness No rash, slight chest wall tenderness   ED Results / Procedures / Treatments   Labs (all labs ordered are listed, but only abnormal results are displayed) Labs Reviewed  BASIC METABOLIC PANEL -  Abnormal; Notable for the following components:      Result Value   Glucose, Bld 126 (*)    Creatinine, Ser 1.05 (*)    Calcium 8.8 (*)    All other components within normal limits  CBC  TROPONIN I (HIGH SENSITIVITY)     EKG  My interpretation of EKG:  Sinus tachycardia rate 107 without any ST elevation, T wave version lead III, normal intervals  RADIOLOGY I have reviewed the xray personally and interpreted no evidence of any pneumonia  PROCEDURES:  Critical Care performed: No  .1-3 Lead EKG Interpretation  Performed by: Concha Se, MD Authorized by: Concha Se, MD     Interpretation: normal     ECG rate:  90   ECG rate assessment: normal     Rhythm: sinus rhythm     Ectopy: none     Conduction: normal      MEDICATIONS ORDERED IN ED: Medications  pantoprazole (PROTONIX) injection 40 mg (40 mg Intravenous Given 03/04/23 1130)  morphine (PF) 4 MG/ML injection 4 mg (4 mg Intravenous Given  03/04/23 1130)  ondansetron (ZOFRAN) injection 4 mg (4 mg Intravenous Given 03/04/23 1130)  iohexol (OMNIPAQUE) 350 MG/ML injection 75 mL (75 mLs Intravenous Contrast Given 03/04/23 1142)     IMPRESSION / MDM / ASSESSMENT AND PLAN / ED COURSE  I reviewed the triage vital signs and the nursing notes.   Patient's presentation is most consistent with acute presentation with potential threat to life or bodily function.   Patient comes in with concerns for chest pain, shortness of breath cardiac markers ordered evaluate for ACS, chest x-ray to evaluate for pneumonia.  This was negative.  We discussed CT imaging to rule out any other cause of symptoms such as pneumonia, PE patient would like to proceed with CT imaging.  No abdominal pain.  Patient given some IV morphine, IV Pepcid in case this could be more of a reflux and IV Zofran  Troponin was negative.  BMP slightly elevated creatinine , CBC reassuring, hepatic function reassuring.  Cardiac markers negative x 2.   COVID-negative.  IMPRESSION: There is no evidence of pulmonary artery embolism. There is no evidence of thoracic aortic dissection. There is no focal pulmonary consolidation. There is no pleural effusion or pneumothorax.  Slightly low oxygen level was noted around 92 to 93% but patient was sleeping in bed after the morphine.  When patient wakes up her oxygen level goes up to 98%  1:19 PMre-evaluated pt she is feeling better.  She denies needing anything else for pain at this time.  We discussed negative CT. cardiac markers x 2 are negative.  Repeat abdominal exam is soft and nontender we discussed the possibility of this being related to reflux, atypical chest pain, musculoskeletal in nature-will give her cardiology follow-up.  I did discuss with patient that although there is no signs of heart attack today I cannot predict future heart attacks and if symptoms are changing she would need to return the ER for evaluation.   The patient is on the cardiac monitor to evaluate for evidence of arrhythmia and/or significant heart rate changes.      FINAL CLINICAL IMPRESSION(S) / ED DIAGNOSES   Final diagnoses:  Atypical chest pain     Rx / DC Orders   ED Discharge Orders          Ordered    Ambulatory referral to Cardiology        03/04/23 1320    pantoprazole (PROTONIX) 20 MG tablet  Daily        03/04/23 1323             Note:  This document was prepared using Dragon voice recognition software and may include unintentional dictation errors.   Concha Se, MD 03/04/23 1325

## 2023-03-07 ENCOUNTER — Telehealth: Payer: Self-pay

## 2023-03-07 NOTE — Transitions of Care (Post Inpatient/ED Visit) (Signed)
   03/07/2023  Name: Barbara Vasquez MRN: [id number] DOB: Mar 18, 1971  Today's TOC FU Call Status: Today's TOC FU Call Status:: Unsuccessul Call (1st Attempt) Unsuccessful Call (1st Attempt) Date: 03/07/23  Attempted to reach the patient regarding the most recent Inpatient/ED visit.  Follow Up Plan: Additional outreach attempts will be made to reach the patient to complete the Transitions of Care (Post Inpatient/ED visit) call.   Signature Donnamarie Poag, CMA

## 2023-03-08 NOTE — Telephone Encounter (Signed)
Noted, will evaluate as scheduled.  

## 2023-03-08 NOTE — Telephone Encounter (Signed)
Patient called back in and stated that she go on lunch around 12:15 and will call back then. Thank you!

## 2023-03-08 NOTE — Transitions of Care (Post Inpatient/ED Visit) (Signed)
Pt had left message to call pt back on lunch hour; pt and I had same lunch hour and I called at very end of lunch hour but left v/m requesting pt to call at [phone].      03/08/2023  Name: Barbara Vasquez MRN: [id number] DOB: Mar 31, 1971  Today's TOC FU Call Status: Today's TOC FU Call Status:: Unsuccessful Call (2nd Attempt) Unsuccessful Call (1st Attempt) Date: 03/07/23 Unsuccessful Call (2nd Attempt) Date: 03/08/23  Attempted to reach the patient regarding the most recent Inpatient/ED visit.  Follow Up Plan: Additional outreach attempts will be made to reach the patient to complete the Transitions of Care (Post Inpatient/ED visit) call.   Signature Lewanda Rife, LPN

## 2023-03-08 NOTE — Transitions of Care (Post Inpatient/ED Visit) (Signed)
Unable to reach pt by phone and requested cb from pt to [phone].     03/08/2023  Name: Barbara Vasquez MRN: [id number] DOB: [birth year]  Today's TOC FU Call Status: Today's TOC FU Call Status:: Unsuccessful Call (2nd Attempt) Unsuccessful Call (1st Attempt) Date: 03/07/23 Unsuccessful Call (2nd Attempt) Date: 03/08/23  Attempted to reach the patient regarding the most recent Inpatient/ED visit.  Follow Up Plan: Additional outreach attempts will be made to reach the patient to complete the Transitions of Care (Post Inpatient/ED visit) call.   Signature Lewanda Rife, LPN

## 2023-03-08 NOTE — Telephone Encounter (Addendum)
Patient returned call, she is on her lunch break (best time to reach her is lunch 12:15-1:15

## 2023-03-08 NOTE — Transitions of Care (Post Inpatient/ED Visit) (Signed)
I was able to speak with pt and she said she is still having some mid chest pain that is not as bad as when seen in ED. Pt trying to eat healthier but does not seem to be helping;pt taking pantoprazole 20 mg but does not see much improvement after taking med. pt said she thinks CP could be associated with anxiety but wants to schedule appt with Allayne Gitelman NP before scheduling with cardiology. Pt scheduled appt with Allayne Gitelman NP on 03/10/23 at 9:20 AM; this was first available appt that pt could schedule. UC & ED precautions given and pt voiced understanding. Sending note to Allayne Gitelman NP.         03/08/2023  Name: Barbara Vasquez MRN: [id number] DOB: Aug 07, 1971  Today's TOC FU Call Status: Today's TOC FU Call Status:: Successful TOC FU Call Competed Unsuccessful Call (1st Attempt) Date: 03/07/23 Unsuccessful Call (2nd Attempt) Date: 03/08/23 [facility] FU Call Complete Date: 03/08/23  Transition Care Management Follow-up Telephone Call Date of Discharge: 03/04/23 Discharge Facility: [facility]) Type of Discharge: Emergency Department Reason for ED Visit: Cardiac Conditions Cardiac Conditions Diagnosis: Chest Pain Persisting How have you been since you were released from the hospital?: Same (pt initially said feeling the same and then she said she was feeling some better; the CP is not as bad as when in ED.) Any questions or concerns?: Yes (pt thinks anxiety might be causing pain in mid chest) Patient Questions/Concerns:: pt said she thinks her CP could be related to anxiety and pt will schedule appt with PCP before card. Patient Questions/Concerns Addressed: Notified Provider of Patient Questions/Concerns  Items Reviewed: Did you receive and understand the discharge instructions provided?: Yes Medications obtained,verified, and reconciled?: Yes (Medications Reviewed) (pt has no questions about meds pt has taken pantoprazole 20 mg before. pt cannot see improvement of pain  while taking pantoprazole.  pt said pain in mid chest is not as bad as when seen in ED.) Any new allergies since your discharge?: No Dietary orders reviewed?: Yes (pt said trying to eat healthier) Type of Diet Ordered:: see note above Do you have support at home?: Yes People in Home: friend(s) (friend) Name of Support/Comfort Primary Source: [name]  Medications Reviewed Today: Medications Reviewed Today     Reviewed by Midge Minium, MD (Physician) on 10/14/22 at 1714  Med List Status: <None>   Medication Order Taking? Sig Documenting Provider Last Dose Status Informant  amLODipine (NORVASC) 10 MG tablet [id number] Yes Take 1 tablet by mouth once daily for blood pressure Doreene Nest, NP Taking Active   cholestyramine Lanetta Inch) 4 GM/DOSE powder [id number] Yes Take 1 packet (4 g total) by mouth 2 (two) times daily with a meal. Wyline Mood, MD Taking Active   dicyclomine (BENTYL) 20 MG tablet [id number] Yes Take 1 tablet (20 mg total) by mouth 3 (three) times daily before meals. Midge Minium, MD  Active   gabapentin (NEURONTIN) 300 MG capsule [id number] Yes Take 1 capsule by mouth at bedtime. [provider] Taking Active   hydrochlorothiazide (HYDRODIURIL) 12.5 MG tablet [id number] Yes Take 12.5 mg by mouth 3 (three) times a week. M,W,F [provider] Taking Active   rosuvastatin (CRESTOR) 20 MG tablet [id number] Yes Take 1 tablet (20 mg total) by mouth daily. for cholesterol. Doreene Nest, NP Taking Active   sertraline (ZOLOFT) 50 MG tablet [id number] Yes Take 1 tablet by mouth daily. [provider] Taking Active  Home Care and Equipment/Supplies: Were Home Health Services Ordered?: NA Any new equipment or medical supplies ordered?: NA  Functional Questionnaire: Do you need assistance with bathing/showering or dressing?: No Do you need assistance with meal preparation?: No Do you need assistance with eating?: No Do you have difficulty  maintaining continence: No Do you need assistance with getting out of bed/getting out of a chair/moving?: No Do you have difficulty managing or taking your medications?: No  Follow up appointments reviewed: PCP Follow-up appointment confirmed?: Yes Date of PCP follow-up appointment?: 03/10/23 Follow-up Provider: Mayra Reel NP [facility] Follow-up appointment confirmed?: NA (pt wants to see Allayne Gitelman NP before scheduling appt with card.) Do you need transportation to your follow-up appointment?: No Do you understand care options if your condition(s) worsen?: Yes-patient verbalized understanding    SIGNATURE Lewanda Rife, LPN

## 2023-03-10 ENCOUNTER — Encounter: Payer: Self-pay | Admitting: Primary Care

## 2023-03-10 ENCOUNTER — Ambulatory Visit: Payer: BC Managed Care – PPO | Admitting: Primary Care

## 2023-03-10 VITALS — BP 136/88 | HR 88 | Temp 98.1°F | Ht 65.0 in | Wt 225.0 lb

## 2023-03-10 DIAGNOSIS — I10 Essential (primary) hypertension: Secondary | ICD-10-CM | POA: Diagnosis not present

## 2023-03-10 DIAGNOSIS — E785 Hyperlipidemia, unspecified: Secondary | ICD-10-CM | POA: Diagnosis not present

## 2023-03-10 DIAGNOSIS — R1012 Left upper quadrant pain: Secondary | ICD-10-CM

## 2023-03-10 DIAGNOSIS — R7303 Prediabetes: Secondary | ICD-10-CM | POA: Diagnosis not present

## 2023-03-10 DIAGNOSIS — R079 Chest pain, unspecified: Secondary | ICD-10-CM

## 2023-03-10 LAB — LIPID PANEL
Cholesterol: 206 mg/dL — ABNORMAL HIGH (ref 0–200)
HDL: 43 mg/dL (ref >39.00)
LDL Cholesterol: 139 mg/dL — ABNORMAL HIGH (ref 0–99)
NonHDL: 162.51
Total CHOL/HDL Ratio: 5
Triglycerides: 119 mg/dL (ref 0.0–149.0)
VLDL: 23.8 mg/dL (ref 0.0–40.0)

## 2023-03-10 NOTE — Assessment & Plan Note (Addendum)
Unclear etiology. ED notes, labs, imaging reviewed. No improvement with PPI. She doesn't appear anxious.  Referral placed to cardiology for further evaluation as she does have risk factors.  Repeat lipid panel pending.

## 2023-03-10 NOTE — Assessment & Plan Note (Signed)
Ongoing. Reviewed GI notes from December 2023.  Ordered H pylori breath test. She will return in 2 weeks once off of PPI for 2 weeks.

## 2023-03-10 NOTE — Assessment & Plan Note (Signed)
Continue rosuvastatin 20 mg. Repeat lipid panel pending.  Add lipoprotein A.

## 2023-03-10 NOTE — Progress Notes (Signed)
Subjective:    Patient ID: Barbara Vasquez, female    DOB: Aug 26, 1971, 52 y.o.   MRN: [id number]  HPI  Barbara Vasquez is a very pleasant 52 y.o. female with a history of hypertension, hyperlipidemia, prediabetes, MDD who presents today for ED follow up.  She presented to [facility] ED on 03/04/23 for a three day history of mid chest pain/tightness with shortness of breath. During her stay in the ED she underwent chest xray which was negative for pneumonia or acute process. Troponin labs were negative. She tested negative for Covid-19 infection. She underwent CTA which was negative for PE. She was treated with IV morphine, IV Pepcid, IV Zofran. Symptoms improved so she was diagnosed with GERD and discharged home later that afternoon with a prescription for pantoprazole 20 mg.   She's been taking pantoprazole daily and has not noticed improvement. She continues to notice intermittent chest pain with and without eating for which she describes as a dull pain. Her pain becomes worse with eating, "feels like a squeezing". She did see GI in December 2023, was provided with a prescription for dicyclomine 20 mg for presumed IBS. This hasn't helped much with her symptoms. She continues to experience LUQ abdominal pain.   She's also noticed more headaches. She did have a dizziness episode while in the shower two nights ago.   When her pain originally began on 03/02/23 she did take a Pepcid which helped to ease off the pain some. She denies feeling overwhelmed and anxious. She checks her BP at home which runs 130's/80's.   BP Readings from Last 3 Encounters:  03/10/23 136/88  03/04/23 (!) 133/96  10/14/22 (!) 152/98     Review of Systems  Respiratory:  Negative for shortness of breath.   Cardiovascular:  Positive for chest pain.  Gastrointestinal:  Positive for abdominal pain and constipation. Negative for vomiting.  Neurological:  Positive for dizziness.  Psychiatric/Behavioral:  The patient is  not nervous/anxious.          Past Medical History:  Diagnosis Date   Essential hypertension    Flank pain 01/01/2021   Heart murmur    as child   Hypercholesteremia     Social History   Socioeconomic History   Marital status: Single    Spouse name: Not on file   Number of children: Not on file   Years of education: Not on file   Highest education level: Not on file  Occupational History   Not on file  Tobacco Use   Smoking status: Never   Smokeless tobacco: Never  Vaping Use   Vaping Use: Never used  Substance and Sexual Activity   Alcohol use: Yes    Comment: occasional   Drug use: No   Sexual activity: Yes    Birth control/protection: Surgical  Other Topics Concern   Not on file  Social History Narrative   Moved from [place].   2 children.  5 grandchildren.   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   Social Determinants of Health   Financial Resource Strain: Not on file  Food Insecurity: Not on file  Transportation Needs: Not on file  Physical Activity: Not on file  Stress: Not on file  Social Connections: Not on file  Intimate Partner Violence: Not on file    Past Surgical History:  Procedure Laterality Date   ABDOMINAL HYSTERECTOMY     CHOLECYSTECTOMY     COLONOSCOPY WITH PROPOFOL N/A  12/04/2020   Procedure: COLONOSCOPY WITH PROPOFOL;  Surgeon: Toney Reil, MD;  [facility] ENDOSCOPY;  Service: Gastroenterology;  Laterality: N/A;   HAND TENDON SURGERY Left 04/01/2020   WRIST ARTHROSCOPY Left 12/11/2019   with  TFC debridment, ulanr shortening osteotomy    Family History  Problem Relation Age of Onset   Hypertension Father    Hypertension Sister    Diabetes Maternal Aunt    Breast cancer Paternal Grandmother 68    Allergies  Allergen Reactions   Penicillins Swelling, Hives and Other (See Comments)    Swelling of the throat Swelling of the throat, chest pain    Current Outpatient Medications on  File Prior to Visit  Medication Sig Dispense Refill   amLODipine (NORVASC) 10 MG tablet Take 1 tablet by mouth once daily for blood pressure 90 tablet 3   rosuvastatin (CRESTOR) 20 MG tablet Take 1 tablet (20 mg total) by mouth daily. for cholesterol. 90 tablet 3   hydrochlorothiazide (HYDRODIURIL) 12.5 MG tablet Take 12.5 mg by mouth 3 (three) times a week. M,W,F (Patient not taking: Reported on 03/10/2023)     sertraline (ZOLOFT) 50 MG tablet Take 1 tablet by mouth daily. (Patient not taking: Reported on 03/10/2023)     No current facility-administered medications on file prior to visit.    BP 136/88   Pulse 88   Temp 98.1 F (36.7 C) (Temporal)   Ht 5\' 5"  (1.651 m)   Wt 225 lb (102.1 kg)   SpO2 98%   BMI 37.44 kg/m  Objective:   Physical Exam Cardiovascular:     Rate and Rhythm: Normal rate and regular rhythm.  Pulmonary:     Effort: Pulmonary effort is normal.     Breath sounds: Normal breath sounds.  Abdominal:     General: Bowel sounds are normal.     Palpations: Abdomen is soft.     Tenderness: There is abdominal tenderness in the left upper quadrant.  Musculoskeletal:     Cervical back: Neck supple.  Skin:    General: Skin is warm and dry.           Assessment & Plan:  LUQ abdominal pain Assessment & Plan: Ongoing. Reviewed GI notes from December 2023.  Ordered H pylori breath test. She will return in 2 weeks once off of PPI for 2 weeks.    Orders: -     H. pylori breath test; Future  Chest pain, unspecified type Assessment & Plan: Unclear etiology. ED notes, labs, imaging reviewed. No improvement with PPI. She doesn't appear anxious.  Referral placed to cardiology for further evaluation as she does have risk factors.  Repeat lipid panel pending.    Orders: -     Ambulatory referral to Cardiology  Hyperlipidemia, unspecified hyperlipidemia type Assessment & Plan: Continue rosuvastatin 20 mg. Repeat lipid panel pending.  Add lipoprotein  A.  Orders: -     Lipid panel -     Lipoprotein A (LPA)        Doreene Nest, NP

## 2023-03-10 NOTE — Patient Instructions (Addendum)
Stop by the lab prior to leaving today. I will notify you of your results once received.   You will either be contacted via phone regarding your referral to cardiology, or you may receive a letter on your MyChart portal from our referral team with instructions for scheduling an appointment. Please let us know if you have not been contacted by anyone within two weeks.  Stop the pantoprazole medication.   Schedule a lab appointment for 2 weeks to return for the H pylori test.  It was a pleasure to see you today!

## 2023-03-16 LAB — LIPOPROTEIN A (LPA): Lipoprotein (a): 27 nmol/L (ref <75)

## 2023-03-24 ENCOUNTER — Other Ambulatory Visit: Payer: BC Managed Care – PPO

## 2023-03-25 ENCOUNTER — Other Ambulatory Visit: Payer: BC Managed Care – PPO

## 2023-03-25 DIAGNOSIS — R1012 Left upper quadrant pain: Secondary | ICD-10-CM | POA: Diagnosis not present

## 2023-03-30 DIAGNOSIS — R079 Chest pain, unspecified: Secondary | ICD-10-CM

## 2023-03-30 DIAGNOSIS — R1013 Epigastric pain: Secondary | ICD-10-CM

## 2023-03-30 LAB — H. PYLORI BREATH TEST: H. pylori Breath Test: NOT DETECTED

## 2023-03-31 MED ORDER — FAMOTIDINE 20 MG PO TABS
20.0000 mg | ORAL_TABLET | Freq: Two times a day (BID) | ORAL | 0 refills | Status: DC
Start: 2023-03-31 — End: 2023-08-05

## 2023-04-18 ENCOUNTER — Telehealth: Payer: Self-pay

## 2023-04-18 NOTE — Telephone Encounter (Signed)
Called and left a voicemail to inquire about any previous cardiac history or has ever seen a cardiologist in the past. Patient is scheduled to see Dr. Azucena Cecil on 04/26/23 as a new patient.

## 2023-04-26 ENCOUNTER — Other Ambulatory Visit
Admission: RE | Admit: 2023-04-26 | Discharge: 2023-04-26 | Disposition: A | Discharge status: Home / Self Care | Payer: BC Managed Care – PPO | Source: Ambulatory Visit | Attending: Cardiology | Admitting: Cardiology

## 2023-04-26 ENCOUNTER — Ambulatory Visit: Payer: BC Managed Care – PPO | Attending: Cardiology | Admitting: Cardiology

## 2023-04-26 ENCOUNTER — Encounter: Payer: Self-pay | Admitting: Cardiology

## 2023-04-26 VITALS — BP 148/102 | HR 85 | Ht 65.0 in | Wt 228.6 lb

## 2023-04-26 DIAGNOSIS — I10 Essential (primary) hypertension: Secondary | ICD-10-CM | POA: Diagnosis not present

## 2023-04-26 DIAGNOSIS — R072 Precordial pain: Secondary | ICD-10-CM | POA: Insufficient documentation

## 2023-04-26 DIAGNOSIS — R0683 Snoring: Secondary | ICD-10-CM

## 2023-04-26 DIAGNOSIS — E7800 Pure hypercholesterolemia, unspecified: Secondary | ICD-10-CM

## 2023-04-26 DIAGNOSIS — Z6838 Body mass index (BMI) 38.0-38.9, adult: Secondary | ICD-10-CM | POA: Diagnosis not present

## 2023-04-26 LAB — BASIC METABOLIC PANEL
Anion gap: 8 (ref 5–15)
BUN: 14 mg/dL (ref 6–20)
CO2: 25 mmol/L (ref 22–32)
Calcium: 9 mg/dL (ref 8.9–10.3)
Chloride: 105 mmol/L (ref 98–111)
Creatinine, Ser: 0.94 mg/dL (ref 0.44–1.00)
GFR, Estimated: >60 mL/min (ref >60)
Glucose, Bld: 109 mg/dL — ABNORMAL HIGH (ref 70–99)
Potassium: 3.5 mmol/L (ref 3.5–5.1)
Sodium: 138 mmol/L (ref 135–145)

## 2023-04-26 MED ORDER — HYDROCHLOROTHIAZIDE 25 MG PO TABS: 25.0000 mg | ORAL_TABLET | Freq: Every day | ORAL | 3 refills | Status: AC

## 2023-04-26 MED ORDER — IVABRADINE HCL 5 MG PO TABS: 10.0000 mg | ORAL_TABLET | Freq: Once | ORAL | 0 refills | Status: AC

## 2023-04-26 MED ORDER — PANTOPRAZOLE SODIUM 40 MG PO TBEC: 40.0000 mg | DELAYED_RELEASE_TABLET | Freq: Every day | ORAL | 3 refills | Status: DC

## 2023-04-26 MED ORDER — METOPROLOL TARTRATE 100 MG PO TABS: 100.0000 mg | ORAL_TABLET | Freq: Once | ORAL | 0 refills | Status: DC

## 2023-04-26 NOTE — Progress Notes (Signed)
Cardiology Office Note:    Date:  04/28/2023   ID:  Barbara Vasquez, DOB 1971-01-18, MRN [id number]  PCP:  Doreene Nest, NP   [facility] Providers Cardiologist:  Debbe Odea, MD     Referring MD: Doreene Nest, NP   Chief Complaint  Patient presents with   New Patient (Initial Visit)    Presented to ED on 03/04/23 with chest pains.  Still having intermittent chest pain with episode happening now.  Also concerned with SOBr. BP elevated on office check.   Barbara Vasquez is a 52 y.o. female who is being seen today for the evaluation of chest pain at the request of Doreene Nest, NP.   History of Present Illness:    Barbara Vasquez is a 52 y.o. female with a hx of hypertension, hyperlipidemia, obesity who presents due to chest pain.  States having symptoms of chest pain over the past month.  Symptoms are not associated with exertion.  Pain is located in the epigastrium, sometimes radiates to her left chest.  Also endorses snoring, daytime fatigue, somnolence.  Has been told by someone she stopped breathing in her sleep.  Blood pressures not adequately controlled, current BP at home in the 140s.  Also endorses leg edema, takes Norvasc 10 mg daily.  Past Medical History:  Diagnosis Date   Essential hypertension    Flank pain 01/01/2021   Heart murmur    as child   Hypercholesteremia     Past Surgical History:  Procedure Laterality Date   ABDOMINAL HYSTERECTOMY     CHOLECYSTECTOMY     COLONOSCOPY WITH PROPOFOL N/A 12/04/2020   Procedure: COLONOSCOPY WITH PROPOFOL;  Surgeon: Toney Reil, MD;  Location: [facility] ENDOSCOPY;  Service: Gastroenterology;  Laterality: N/A;   HAND TENDON SURGERY Left 04/01/2020   WRIST ARTHROSCOPY Left 12/11/2019   with  TFC debridment, ulanr shortening osteotomy    Current Medications: Current Meds  Medication Sig   amLODipine (NORVASC) 10 MG tablet Take 1 tablet by mouth once daily for blood pressure    famotidine (PEPCID) 20 MG tablet Take 1 tablet (20 mg total) by mouth 2 (two) times daily.   hydrochlorothiazide (HYDRODIURIL) 25 MG tablet Take 1 tablet (25 mg total) by mouth daily.   [EXPIRED] ivabradine (CORLANOR) 5 MG TABS tablet Take 2 tablets (10 mg total) by mouth once for 1 dose. TWO HOURS PRIOR TO CARDIAC CTA   metoprolol tartrate (LOPRESSOR) 100 MG tablet Take 1 tablet (100 mg total) by mouth once for 1 dose. TWO HOURS PRIOR TO CARDIAC CTA   pantoprazole (PROTONIX) 40 MG tablet Take 1 tablet (40 mg total) by mouth daily.   rosuvastatin (CRESTOR) 20 MG tablet Take 1 tablet (20 mg total) by mouth daily. for cholesterol.     Allergies:   Penicillins   Social History   Socioeconomic History   Marital status: Single    Spouse name: Not on file   Number of children: Not on file   Years of education: Not on file   Highest education level: Not on file  Occupational History   Not on file  Tobacco Use   Smoking status: Never   Smokeless tobacco: Never  Vaping Use   Vaping Use: Never used  Substance and Sexual Activity   Alcohol use: Never   Drug use: No   Sexual activity: Yes    Birth control/protection: Surgical  Other Topics Concern   Not on file  Social History Narrative   Moved from  [place].   2 children.  5 grandchildren.   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   Social Determinants of Health   Financial Resource Strain: Not on file  Food Insecurity: Not on file  Transportation Needs: Not on file  Physical Activity: Not on file  Stress: Not on file  Social Connections: Not on file     Family History: The patient's family history includes Breast cancer (age of onset: 41) in her paternal grandmother; Diabetes in her maternal aunt; Heart murmur in her father; Hypertension in her father and sister.  ROS:   Please see the history of present illness.     All other systems reviewed and are negative.  EKGs/Labs/Other Studies  Reviewed:    The following studies were reviewed today:   EKG Interpretation Date/Time:  Tuesday April 26 2023 08:57:09 EDT Ventricular Rate:  88 PR Interval:  154 QRS Duration:  78 QT Interval:  368 QTC Calculation: 445 R Axis:   11  Text Interpretation: Normal sinus rhythm Inferior infarct POSSIBLE OLD Cannot rule out Anterior infarct , age undetermined Confirmed by Debbe Odea ([id number]) on 04/28/2023 5:47:24 PM  Recent Labs: 03/04/2023: ALT 24; Hemoglobin 13.0; Platelets 349 04/26/2023: BUN 14; Creatinine, Ser 0.94; Potassium 3.5; Sodium 138  Recent Lipid Panel    Component Value Date/Time   CHOL 206 (H) 03/10/2023 0956   TRIG 119.0 03/10/2023 0956   HDL 43.00 03/10/2023 0956   CHOLHDL 5 03/10/2023 0956   VLDL 23.8 03/10/2023 0956   LDLCALC 139 (H) 03/10/2023 0956   LDLDIRECT 159.0 11/19/2020 1542     Risk Assessment/Calculations:     Physical Exam:    VS:  BP (!) 148/102 (BP Location: Left Arm, Patient Position: Sitting, Cuff Size: Large)   Pulse 85   Ht 5\' 5"  (1.651 m)   Wt 228 lb 9.6 oz (103.7 kg)   SpO2 99%   BMI 38.04 kg/m     Wt Readings from Last 3 Encounters:  04/26/23 228 lb 9.6 oz (103.7 kg)  03/10/23 225 lb (102.1 kg)  03/04/23 224 lb (101.6 kg)     GEN:  Well nourished, well developed in no acute distress HEENT: Normal NECK: No JVD; No carotid bruits CARDIAC: RRR, no murmurs, rubs, gallops RESPIRATORY:  Clear to auscultation without rales, wheezing or rhonchi  ABDOMEN: Soft, non-tender, non-distended MUSCULOSKELETAL: Trace edema; No deformity  SKIN: Warm and dry NEUROLOGIC:  Alert and oriented x 3 PSYCHIATRIC:  Normal affect   ASSESSMENT:    1. Precordial pain   2. Primary hypertension   3. Pure hypercholesterolemia   4. BMI 38.0-38.9,adult   5. Snoring    PLAN:    In order of problems listed above:  Chest pain, has several cardiac risk factors.  Get echo, get coronary CTA.  Complaint of GERD, start Protonix 40 mg  daily. Hypertension, BP not controlled.  Trace edema.  Start HCTZ 25 mg daily, continue Norvasc 10 mg daily. Hyperlipidemia, continue Crestor 20 mg daily.   obesity, low-calorie diet, weight loss advised.  Refer to nutritional medicine/dietary services. Snoring, daytime fatigue.  Symptoms consistent with sleep apnea.  Refer to sleep specialist for OSA eval and treatment if diagnosed.  Follow-up after cardiac testing      Medication Adjustments/Labs and Tests Ordered: Current medicines are reviewed at length with the patient today.  Concerns regarding medicines are outlined above.  Orders Placed This Encounter  Procedures   CT CORONARY MORPH W/CTA  COR W/SCORE W/CA W/CM &/OR WO/CM   Basic Metabolic Panel (BMET)   Ambulatory referral to Pulmonology   Amb ref to Medical Nutrition Therapy-MNT   EKG 12-Lead   ECHOCARDIOGRAM COMPLETE   Meds ordered this encounter  Medications   hydrochlorothiazide (HYDRODIURIL) 25 MG tablet    Sig: Take 1 tablet (25 mg total) by mouth daily.    Dispense:  90 tablet    Refill:  3   pantoprazole (PROTONIX) 40 MG tablet    Sig: Take 1 tablet (40 mg total) by mouth daily.    Dispense:  90 tablet    Refill:  3   metoprolol tartrate (LOPRESSOR) 100 MG tablet    Sig: Take 1 tablet (100 mg total) by mouth once for 1 dose. TWO HOURS PRIOR TO CARDIAC CTA    Dispense:  1 tablet    Refill:  0   ivabradine (CORLANOR) 5 MG TABS tablet    Sig: Take 2 tablets (10 mg total) by mouth once for 1 dose. TWO HOURS PRIOR TO CARDIAC CTA    Dispense:  2 tablet    Refill:  0    Patient Instructions  Medication Instructions:   START hydrochlorothiazide (HYDRODIURIL) 25 MG tablet - Take one tablet (25mg ) by mouth daily.  2. START pantoprazole (PROTONIX) 40 MG tablet - Take one tablet (40mg ) by mouth daily.  *If you need a refill on your cardiac medications before your next appointment, please call your pharmacy*   Lab Work:  Your physician recommends you going to  the medical mall for labs - BMP  If you have labs (blood work) drawn today and your tests are completely normal, you will receive your results only by: MyChart Message (if you have MyChart) OR A paper copy in the mail If you have any lab test that is abnormal or we need to change your treatment, we will call you to review the results.   Testing/Procedures:  Your physician has requested that you have an echocardiogram. Echocardiography is a painless test that uses sound waves to create images of your heart. It provides your doctor with information about the size and shape of your heart and how well your heart's chambers and valves are working. This procedure takes approximately one hour. There are no restrictions for this procedure. Please do NOT wear cologne, perfume, aftershave, or lotions (deodorant is allowed). Please arrive 15 minutes prior to your appointment time.    Your cardiac CT will be scheduled at one of the below locations:   [facility] [address] [place], [place] [address] [phone]  OR  [facility] [address] [place], [place] [address] [phone]  OR   [facility] [address] [place], [place] [address] [phone]  If scheduled at [facility], please arrive at the [facility] and Children's Entrance (Entrance C2) of [facility] 30 minutes prior to test start time. You can use the FREE valet parking offered at entrance C (encouraged to control the heart rate for the test)  Proceed to the [facility] Radiology Department (first floor) to check-in and test prep.  All radiology patients and guests should use entrance C2 at [facility], accessed from [facility], even though the hospital's physical address listed is [address]    If scheduled at [facility] or [facility],  please arrive 15 mins early for check-in and test prep.  Please follow these instructions carefully (unless otherwise directed):  An IV will be required for this test and Nitroglycerin will be given.  Hold all erectile dysfunction medications at least 3 days (72 hrs) prior to test. (Ie viagra, cialis, sildenafil, tadalafil, etc)   On the Night Before the Test: Be sure to Drink plenty of water. Do not consume any caffeinated/decaffeinated beverages or chocolate 12 hours prior to your test. Do not take any antihistamines 12 hours prior to your test.   On the Day of the Test: Drink plenty of water until 1 hour prior to the test. Do not eat any food 1 hour prior to test. You may take your regular medications prior to the test.  Take metoprolol (Lopressor) two hours prior to test. Take Corlanor two hours prior to cardiac cta If you take Furosemide/Hydrochlorothiazide/Spironolactone, please HOLD on the morning of the test. FEMALES- please wear underwire-free bra if available, avoid dresses & tight clothing      After the Test: Drink plenty of water. After receiving IV contrast, you may experience a mild flushed feeling. This is normal. On occasion, you may experience a mild rash up to 24 hours after the test. This is not dangerous. If this occurs, you can take Benadryl 25 mg and increase your fluid intake. If you experience trouble breathing, this can be serious. If it is severe call 911 IMMEDIATELY. If it is mild, please call our office. If you take any of these medications: Glipizide/Metformin, Avandament, Glucavance, please do not take 48 hours after completing test unless otherwise instructed.  We will call to schedule your test 2-4 weeks out understanding that some insurance companies will need an authorization prior to the service being performed.   For more information and frequently asked questions, please visit our website : [email or web address]  For non-scheduling  related questions, please contact the cardiac imaging nurse navigator should you have any questions/concerns: Rockwell Alexandria, Cardiac Imaging Nurse Navigator Larey Brick, Cardiac Imaging Nurse Navigator [facility] Direct Office Dial: [phone]   For scheduling needs, including cancellations and rescheduling, please call [place], [phone].    Follow-Up: At [facility], you and your health needs are our priority.  As part of our continuing mission to provide you with exceptional heart care, we have created designated Provider Care Teams.  These Care Teams include your primary Cardiologist (physician) and Advanced Practice Providers (APPs -  Physician Assistants and Nurse Practitioners) who all work together to provide you with the care you need, when you need it.  We recommend signing up for the patient portal called "MyChart".  Sign up information is provided on this After Visit Summary.  MyChart is used to connect with patients for Virtual Visits (Telemedicine).  Patients are able to view lab/test results, encounter notes, upcoming appointments, etc.  Non-urgent messages can be sent to your provider as well.   To learn more about what you can do with MyChart, go to [email or web address].    Your next appointment:    8- 10 weeks  Provider:   You may see Debbe Odea, MD or one of the following Advanced Practice Providers on your designated Care Team:   Nicolasa Ducking, NP Eula Listen, PA-C Cadence Fransico Michael, PA-C Charlsie Quest, NP    Signed, Debbe Odea, MD  04/28/2023 5:48 PM    [facility]

## 2023-04-26 NOTE — Patient Instructions (Signed)
Medication Instructions:   START hydrochlorothiazide (HYDRODIURIL) 25 MG tablet - Take one tablet (25mg ) by mouth daily.  2. START pantoprazole (PROTONIX) 40 MG tablet - Take one tablet (40mg ) by mouth daily.  *If you need a refill on your cardiac medications before your next appointment, please call your pharmacy*   Lab Work:  Your physician recommends you going to the medical mall for labs - BMP  If you have labs (blood work) drawn today and your tests are completely normal, you will receive your results only by: MyChart Message (if you have MyChart) OR A paper copy in the mail If you have any lab test that is abnormal or we need to change your treatment, we will call you to review the results.   Testing/Procedures:  Your physician has requested that you have an echocardiogram. Echocardiography is a painless test that uses sound waves to create images of your heart. It provides your doctor with information about the size and shape of your heart and how well your heart's chambers and valves are working. This procedure takes approximately one hour. There are no restrictions for this procedure. Please do NOT wear cologne, perfume, aftershave, or lotions (deodorant is allowed). Please arrive 15 minutes prior to your appointment time.    Your cardiac CT will be scheduled at one of the below locations:   [facility] [address] [place], [place] [address] [phone]  OR  [facility] [address] [place], [place] [address] [phone]  OR   [facility] [address] [place], [place] [address] [phone]  If scheduled at [facility], please arrive at the [facility] and Children's Entrance (Entrance C2) of [facility] 30 minutes prior to test start time. You can use the FREE valet parking offered at entrance C (encouraged to control the heart rate for the test)  Proceed  to the [facility] Radiology Department (first floor) to check-in and test prep.  All radiology patients and guests should use entrance C2 at [facility], accessed from [facility], even though the hospital's physical address listed is [address].    If scheduled at [facility] or [facility], please arrive 15 mins early for check-in and test prep.   Please follow these instructions carefully (unless otherwise directed):  An IV will be required for this test and Nitroglycerin will be given.  Hold all erectile dysfunction medications at least 3 days (72 hrs) prior to test. (Ie viagra, cialis, sildenafil, tadalafil, etc)   On the Night Before the Test: Be sure to Drink plenty of water. Do not consume any caffeinated/decaffeinated beverages or chocolate 12 hours prior to your test. Do not take any antihistamines 12 hours prior to your test.   On the Day of the Test: Drink plenty of water until 1 hour prior to the test. Do not eat any food 1 hour prior to test. You may take your regular medications prior to the test.  Take metoprolol (Lopressor) two hours prior to test. Take Corlanor two hours prior to cardiac cta If you take Furosemide/Hydrochlorothiazide/Spironolactone, please HOLD on the morning of the test. FEMALES- please wear underwire-free bra if available, avoid dresses & tight clothing      After the Test: Drink plenty of water. After receiving IV contrast, you may experience a mild flushed feeling. This is normal. On occasion, you may experience a mild rash up to  24 hours after the test. This is not dangerous. If this occurs, you can take Benadryl 25 mg and increase your fluid intake. If you experience trouble breathing, this can be serious. If it is severe call 911 IMMEDIATELY. If it is mild, please call our office. If you take any of these medications: Glipizide/Metformin, Avandament, Glucavance,  please do not take 48 hours after completing test unless otherwise instructed.  We will call to schedule your test 2-4 weeks out understanding that some insurance companies will need an authorization prior to the service being performed.   For more information and frequently asked questions, please visit our website : [email or web address]  For non-scheduling related questions, please contact the cardiac imaging nurse navigator should you have any questions/concerns: Rockwell Alexandria, Cardiac Imaging Nurse Navigator Larey Brick, Cardiac Imaging Nurse Navigator [facility] Direct Office Dial: [phone]   For scheduling needs, including cancellations and rescheduling, please call [place], [phone].    Follow-Up: At [facility], you and your health needs are our priority.  As part of our continuing mission to provide you with exceptional heart care, we have created designated Provider Care Teams.  These Care Teams include your primary Cardiologist (physician) and Advanced Practice Providers (APPs -  Physician Assistants and Nurse Practitioners) who all work together to provide you with the care you need, when you need it.  We recommend signing up for the patient portal called "MyChart".  Sign up information is provided on this After Visit Summary.  MyChart is used to connect with patients for Virtual Visits (Telemedicine).  Patients are able to view lab/test results, encounter notes, upcoming appointments, etc.  Non-urgent messages can be sent to your provider as well.   To learn more about what you can do with MyChart, go to [email or web address].    Your next appointment:    8- 10 weeks  Provider:   You may see Debbe Odea, MD or one of the following Advanced Practice Providers on your designated Care Team:   Nicolasa Ducking, NP Eula Listen, PA-C Cadence Fransico Michael, PA-C Charlsie Quest, NP

## 2023-04-28 NOTE — Addendum Note (Signed)
Addended bySherri Rad C on: 04/28/2023 10:06 AM   Modules accepted: Orders

## 2023-05-10 ENCOUNTER — Ambulatory Visit: Payer: BC Managed Care – PPO | Attending: Cardiology

## 2023-05-10 ENCOUNTER — Other Ambulatory Visit: Payer: BC Managed Care – PPO

## 2023-05-10 DIAGNOSIS — R072 Precordial pain: Secondary | ICD-10-CM

## 2023-05-10 LAB — ECHOCARDIOGRAM COMPLETE
Area-P 1/2: 5.02 cm2
S' Lateral: 2.1 cm

## 2023-05-23 DIAGNOSIS — Z03818 Encounter for observation for suspected exposure to other biological agents ruled out: Secondary | ICD-10-CM | POA: Diagnosis not present

## 2023-05-26 ENCOUNTER — Ambulatory Visit
Admission: RE | Admit: 2023-05-26 | Discharge: 2023-05-26 | Disposition: A | Discharge status: Home / Self Care | Payer: BC Managed Care – PPO | Source: Ambulatory Visit | Attending: Cardiology | Admitting: Cardiology

## 2023-05-26 DIAGNOSIS — R072 Precordial pain: Secondary | ICD-10-CM | POA: Insufficient documentation

## 2023-05-26 MED ORDER — METOPROLOL TARTRATE 5 MG/5ML IV SOLN
10.0000 mg | Freq: Once | INTRAVENOUS | Status: AC
  Administered 2023-05-26: 10 mg via INTRAVENOUS

## 2023-05-26 MED ORDER — NITROGLYCERIN 0.4 MG SL SUBL
0.8000 mg | SUBLINGUAL_TABLET | Freq: Once | SUBLINGUAL | Status: AC
  Administered 2023-05-26: 0.8 mg via SUBLINGUAL

## 2023-05-26 MED ORDER — DILTIAZEM HCL 25 MG/5ML IV SOLN
10.0000 mg | Freq: Once | INTRAVENOUS | Status: AC
  Administered 2023-05-26: 10 mg via INTRAVENOUS

## 2023-05-26 MED ORDER — IOHEXOL 350 MG/ML SOLN
100.0000 mL | Freq: Once | INTRAVENOUS | Status: AC | PRN
  Administered 2023-05-26: 100 mL via INTRAVENOUS

## 2023-05-26 MED ORDER — SODIUM CHLORIDE 0.9 % IV BOLUS
150.0000 mL | Freq: Once | INTRAVENOUS | Status: AC
  Administered 2023-05-26: 150 mL via INTRAVENOUS

## 2023-05-26 NOTE — Progress Notes (Signed)
Patient arrived for cardiac CT and took pre medications Metoprolol 100mg  and Ivabradine 10mg . Attempted to scan patient with additional IV medications however heart rate would not decreased in order to have the scan. Dr Azucena Cecil ordered patient to be scanned at [facility] with premedications Metoprolol 100 mg and Ivabradine 15 mg. Nurse navigators notified. Patient verbalized understanding of plan of care.

## 2023-05-27 ENCOUNTER — Other Ambulatory Visit: Payer: Self-pay | Admitting: Primary Care

## 2023-05-27 ENCOUNTER — Encounter: Payer: Self-pay | Admitting: Cardiology

## 2023-05-27 DIAGNOSIS — I10 Essential (primary) hypertension: Secondary | ICD-10-CM

## 2023-06-09 ENCOUNTER — Encounter (HOSPITAL_COMMUNITY): Payer: Self-pay

## 2023-06-09 ENCOUNTER — Telehealth (HOSPITAL_COMMUNITY): Payer: Self-pay | Admitting: Emergency Medicine

## 2023-06-09 ENCOUNTER — Other Ambulatory Visit (HOSPITAL_COMMUNITY): Payer: Self-pay | Admitting: Emergency Medicine

## 2023-06-09 ENCOUNTER — Telehealth (HOSPITAL_COMMUNITY): Payer: Self-pay | Admitting: *Deleted

## 2023-06-09 DIAGNOSIS — R079 Chest pain, unspecified: Secondary | ICD-10-CM

## 2023-06-09 MED ORDER — METOPROLOL TARTRATE 100 MG PO TABS: 100.0000 mg | ORAL_TABLET | Freq: Once | ORAL | 0 refills | Status: DC

## 2023-06-09 MED ORDER — IVABRADINE HCL 5 MG PO TABS: 15.0000 mg | ORAL_TABLET | Freq: Once | ORAL | 0 refills | Status: AC

## 2023-06-09 NOTE — Telephone Encounter (Signed)
Attempted to call patient regarding upcoming cardiac CT appointment. °Left message on voicemail with name and callback number °  RN Navigator Cardiac Imaging °[facility] °[phone] Office °[phone] Cell ° °

## 2023-06-09 NOTE — Telephone Encounter (Signed)
Patient returning call about her upcoming cardiac imaging study; pt verbalizes understanding of appt date/time, parking situation and where to check in, pre-test NPO status and medications ordered, and verified current allergies; name and call back number provided for further questions should they arise    RN Navigator Cardiac Imaging [facility] [phone] office [phone] cell  Patient to take 100mg metoprolol tartrate and 15mg ivabradine two hours prior to her cardiac CT scan. 

## 2023-06-10 ENCOUNTER — Other Ambulatory Visit (HOSPITAL_COMMUNITY): Payer: Self-pay | Admitting: *Deleted

## 2023-06-10 ENCOUNTER — Telehealth: Payer: Self-pay | Admitting: Pharmacy Technician

## 2023-06-10 MED ORDER — IVABRADINE HCL 7.5 MG PO TABS: ORAL_TABLET | ORAL | 0 refills | Status: DC

## 2023-06-10 NOTE — Telephone Encounter (Signed)
Pharmacy Patient Advocate Encounter   Received notification from CoverMyMeds that prior authorization for Ivabradine HCl 7.5MG  tablets is required/requested.   Insurance verification completed.   The patient is insured through [employer] .   Per test claim: PA required; PA submitted to [employer] via CoverMyMeds Key/confirmation #/EOC [id number] Status is pending

## 2023-06-13 ENCOUNTER — Ambulatory Visit
Admission: RE | Admit: 2023-06-13 | Discharge: 2023-06-13 | Disposition: A | Discharge status: Home / Self Care | Payer: BC Managed Care – PPO | Source: Ambulatory Visit | Attending: Cardiology | Admitting: Cardiology

## 2023-06-13 DIAGNOSIS — R072 Precordial pain: Secondary | ICD-10-CM | POA: Insufficient documentation

## 2023-06-13 LAB — POCT I-STAT CREATININE: Creatinine, Ser: 1.2 mg/dL — ABNORMAL HIGH (ref 0.44–1.00)

## 2023-06-13 MED ORDER — NITROGLYCERIN 0.4 MG SL SUBL
0.8000 mg | SUBLINGUAL_TABLET | Freq: Once | SUBLINGUAL | Status: AC
  Administered 2023-06-13: 0.8 mg via SUBLINGUAL
  Filled 2023-06-13: qty 25

## 2023-06-13 MED ORDER — METOPROLOL TARTRATE 5 MG/5ML IV SOLN
INTRAVENOUS | Status: AC
  Filled 2023-06-13: qty 10

## 2023-06-13 MED ORDER — IOHEXOL 350 MG/ML SOLN
80.0000 mL | Freq: Once | INTRAVENOUS | Status: AC | PRN
  Administered 2023-06-13: 80 mL via INTRAVENOUS

## 2023-06-13 MED ORDER — METOPROLOL TARTRATE 5 MG/5ML IV SOLN
10.0000 mg | Freq: Once | INTRAVENOUS | Status: AC
  Administered 2023-06-13: 5 mg via INTRAVENOUS
  Filled 2023-06-13: qty 10

## 2023-06-13 NOTE — Progress Notes (Signed)

## 2023-06-15 NOTE — Telephone Encounter (Signed)
Pharmacy Patient Advocate Encounter  Received notification from [facility] that Prior Authorization for Ivabradine hcl 7.5mg  tab has been DENIED. Please advise how you'd like to proceed. Full denial letter will be uploaded to the media tab. See denial reason below.   PA #/Case ID/Reference #: [id number]

## 2023-06-17 ENCOUNTER — Ambulatory Visit: Payer: BC Managed Care – PPO | Admitting: Dietician

## 2023-06-28 ENCOUNTER — Encounter: Payer: Self-pay | Admitting: Cardiology

## 2023-06-28 ENCOUNTER — Ambulatory Visit: Payer: BC Managed Care – PPO | Attending: Cardiology | Admitting: Cardiology

## 2023-06-28 VITALS — BP 124/94 | HR 82 | Ht 65.0 in | Wt 228.4 lb

## 2023-06-28 DIAGNOSIS — I10 Essential (primary) hypertension: Secondary | ICD-10-CM | POA: Diagnosis not present

## 2023-06-28 DIAGNOSIS — R079 Chest pain, unspecified: Secondary | ICD-10-CM

## 2023-06-28 DIAGNOSIS — E7800 Pure hypercholesterolemia, unspecified: Secondary | ICD-10-CM | POA: Diagnosis not present

## 2023-06-28 DIAGNOSIS — Z6838 Body mass index (BMI) 38.0-38.9, adult: Secondary | ICD-10-CM

## 2023-06-28 NOTE — Progress Notes (Signed)
Cardiology Office Note:    Date:  06/28/2023   ID:  Rolando Greger, DOB 1970-11-23, MRN [id number]  PCP:  Doreene Nest, NP   [facility] Providers Cardiologist:  Debbe Odea, MD     Referring MD: Doreene Nest, NP   Chief Complaint  Patient presents with   Follow-up    Discuss cardiac testing results.  Patient reports no improvement with intermittent chest pain and increased fatigue.      History of Present Illness:    Barbara Vasquez is a 52 y.o. female with a hx of hypertension, hyperlipidemia, obesity who presents for follow-up.  Previously seen due to chest pain.  Echocardiogram and coronary CTA obtained to evaluate presence of CAD.  Also endorsed shortness of breath, daytime fatigue, somnolence.  Was referred to sleep specialist, has not made appointment yet, due to concerns about cost for seeing a specialist.  Presents for cardiac testing results.  Endorse having abdominal discomfort, which is chronic.  Has seen to GI specialist in the past.  HCTZ added to BP regimen after last visit due to elevated BP.  Past Medical History:  Diagnosis Date   Essential hypertension    Flank pain 01/01/2021   Heart murmur    as child   Hypercholesteremia     Past Surgical History:  Procedure Laterality Date   ABDOMINAL HYSTERECTOMY     CHOLECYSTECTOMY     COLONOSCOPY WITH PROPOFOL N/A 12/04/2020   Procedure: COLONOSCOPY WITH PROPOFOL;  Surgeon: Toney Reil, MD;  Location: [facility] ENDOSCOPY;  Service: Gastroenterology;  Laterality: N/A;   HAND TENDON SURGERY Left 04/01/2020   WRIST ARTHROSCOPY Left 12/11/2019   with  TFC debridment, ulanr shortening osteotomy    Current Medications: Current Meds  Medication Sig   amLODipine (NORVASC) 10 MG tablet Take 1 tablet by mouth once daily for blood pressure   hydrochlorothiazide (HYDRODIURIL) 25 MG tablet Take 1 tablet (25 mg total) by mouth daily.   rosuvastatin (CRESTOR) 20 MG tablet Take 1 tablet  (20 mg total) by mouth daily. for cholesterol.     Allergies:   Penicillins   Social History   Socioeconomic History   Marital status: Single    Spouse name: Not on file   Number of children: Not on file   Years of education: Not on file   Highest education level: Not on file  Occupational History   Not on file  Tobacco Use   Smoking status: Never   Smokeless tobacco: Never  Vaping Use   Vaping status: Never Used  Substance and Sexual Activity   Alcohol use: Never   Drug use: No   Sexual activity: Yes    Birth control/protection: Surgical  Other Topics Concern   Not on file  Social History Narrative   Moved from [place].   2 children.  5 grandchildren.   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   Social Determinants of Health   Financial Resource Strain: Not on file  Food Insecurity: Not on file  Transportation Needs: Not on file  Physical Activity: Not on file  Stress: Not on file  Social Connections: Not on file     Family History: The patient's family history includes Breast cancer (age of onset: 60) in her paternal grandmother; Diabetes in her maternal aunt; Heart murmur in her father; Hypertension in her father and sister.  ROS:   Please see the history of present illness.  All other systems reviewed and are negative.  EKGs/Labs/Other Studies Reviewed:    The following studies were reviewed today:    Recent Labs: 03/04/2023: ALT 24; Hemoglobin 13.0; Platelets 349 04/26/2023: BUN 14; Potassium 3.5; Sodium 138 06/13/2023: Creatinine, Ser 1.20  Recent Lipid Panel    Component Value Date/Time   CHOL 206 (H) 03/10/2023 0956   TRIG 119.0 03/10/2023 0956   HDL 43.00 03/10/2023 0956   CHOLHDL 5 03/10/2023 0956   VLDL 23.8 03/10/2023 0956   LDLCALC 139 (H) 03/10/2023 0956   LDLDIRECT 159.0 11/19/2020 1542     Risk Assessment/Calculations:     Physical Exam:    VS:  BP (!) 124/94 (BP Location: Left Arm, Patient  Position: Sitting, Cuff Size: Large)   Pulse 82   Ht 5\' 5"  (1.651 m)   Wt 228 lb 6.4 oz (103.6 kg)   SpO2 97%   BMI 38.01 kg/m     Wt Readings from Last 3 Encounters:  06/28/23 228 lb 6.4 oz (103.6 kg)  04/26/23 228 lb 9.6 oz (103.7 kg)  03/10/23 225 lb (102.1 kg)     GEN:  Well nourished, well developed in no acute distress HEENT: Normal NECK: No JVD; No carotid bruits CARDIAC: RRR, no murmurs, rubs, gallops RESPIRATORY:  Clear to auscultation without rales, wheezing or rhonchi  ABDOMEN: Soft, non-tender, non-distended MUSCULOSKELETAL: no edema; right scapula/axillary area tender to palpation SKIN: Warm and dry NEUROLOGIC:  Alert and oriented x 3 PSYCHIATRIC:  Normal affect   ASSESSMENT:    1. Chest pain, unspecified type   2. Primary hypertension   3. Pure hypercholesterolemia   4. BMI 38.0-38.9,adult     PLAN:    In order of problems listed above:  Chest pain, right scapula tender to palpation.  Coronary CT with no CAD.  Echo with normal EF 55 to 60%.  Etiology appears musculoskeletal. Hypertension, much better.  Continue HCTZ 25 mg daily, Norvasc 10 mg daily. Hyperlipidemia, continue Crestor 20 mg daily.  Plans to follow-up with PCP for repeat lipid panel in a month or 2. obesity, low-calorie diet, weight loss advised.  Advised to follow-up/make appointment with sleep specialist due to persistent fatigue, poor sleep habits.  Follow-up as needed      Medication Adjustments/Labs and Tests Ordered: Current medicines are reviewed at length with the patient today.  Concerns regarding medicines are outlined above.  No orders of the defined types were placed in this encounter.  No orders of the defined types were placed in this encounter.   Patient Instructions  Medication Instructions:   Your physician recommends that you continue on your current medications as directed. Please refer to the Current Medication list given to you today.  *If you need a refill on  your cardiac medications before your next appointment, please call your pharmacy*   Lab Work:  None Ordered  If you have labs (blood work) drawn today and your tests are completely normal, you will receive your results only by: MyChart Message (if you have MyChart) OR A paper copy in the mail If you have any lab test that is abnormal or we need to change your treatment, we will call you to review the results.   Testing/Procedures:  None Ordered   Follow-Up: At [facility], you and your health needs are our priority.  As part of our continuing mission to provide you with exceptional heart care, we have created designated Provider Care Teams.  These Care Teams include your primary Cardiologist (physician)  and Advanced Practice Providers (APPs -  Physician Assistants and Nurse Practitioners) who all work together to provide you with the care you need, when you need it.  We recommend signing up for the patient portal called "MyChart".  Sign up information is provided on this After Visit Summary.  MyChart is used to connect with patients for Virtual Visits (Telemedicine).  Patients are able to view lab/test results, encounter notes, upcoming appointments, etc.  Non-urgent messages can be sent to your provider as well.   To learn more about what you can do with MyChart, go to [email or web address].    Your next appointment:   PRN    Signed, Debbe Odea, MD  06/28/2023 1:00 PM    [facility]

## 2023-06-28 NOTE — Patient Instructions (Signed)
Medication Instructions:   Your physician recommends that you continue on your current medications as directed. Please refer to the Current Medication list given to you today.  *If you need a refill on your cardiac medications before your next appointment, please call your pharmacy*   Lab Work:  None Ordered  If you have labs (blood work) drawn today and your tests are completely normal, you will receive your results only by: [place] (if you have MyChart) OR A paper copy in the mail If you have any lab test that is abnormal or we need to change your treatment, we will call you to review the results.   Testing/Procedures:  None Ordered   Follow-Up: At [facility], you and your health needs are our priority.  As part of our continuing mission to provide you with exceptional heart care, we have created designated Provider Care Teams.  These Care Teams include your primary Cardiologist (physician) and Advanced Practice Providers (APPs -  Physician Assistants and Nurse Practitioners) who all work together to provide you with the care you need, when you need it.  We recommend signing up for the patient portal called "MyChart".  Sign up information is provided on this After Visit Summary.  MyChart is used to connect with patients for Virtual Visits (Telemedicine).  Patients are able to view lab/test results, encounter notes, upcoming appointments, etc.  Non-urgent messages can be sent to your provider as well.   To learn more about what you can do with MyChart, go to [email or web address].    Your next appointment:  PRN

## 2023-08-01 ENCOUNTER — Other Ambulatory Visit: Payer: Self-pay

## 2023-08-01 DIAGNOSIS — Z1231 Encounter for screening mammogram for malignant neoplasm of breast: Secondary | ICD-10-CM

## 2023-08-01 NOTE — Telephone Encounter (Signed)
Updated order sent. Message sent to patient to make aware.

## 2023-08-02 NOTE — Addendum Note (Signed)
Addended by: Donnamarie Poag on: 08/02/2023 07:20 AM   Modules accepted: Orders

## 2023-08-05 ENCOUNTER — Encounter: Payer: Self-pay | Admitting: Primary Care

## 2023-08-05 ENCOUNTER — Ambulatory Visit: Payer: BC Managed Care – PPO | Admitting: Primary Care

## 2023-08-05 VITALS — BP 110/80 | HR 90 | Temp 98.4°F | Ht 65.0 in | Wt 230.0 lb

## 2023-08-05 DIAGNOSIS — I10 Essential (primary) hypertension: Secondary | ICD-10-CM

## 2023-08-05 DIAGNOSIS — F330 Major depressive disorder, recurrent, mild: Secondary | ICD-10-CM

## 2023-08-05 DIAGNOSIS — E785 Hyperlipidemia, unspecified: Secondary | ICD-10-CM

## 2023-08-05 DIAGNOSIS — R7303 Prediabetes: Secondary | ICD-10-CM

## 2023-08-05 DIAGNOSIS — Z0000 Encounter for general adult medical examination without abnormal findings: Secondary | ICD-10-CM | POA: Diagnosis not present

## 2023-08-05 LAB — COMPREHENSIVE METABOLIC PANEL
ALT: 20 U/L (ref 0–35)
AST: 17 U/L (ref 0–37)
Albumin: 4.2 g/dL (ref 3.5–5.2)
Alkaline Phosphatase: 88 U/L (ref 39–117)
BUN: 17 mg/dL (ref 6–23)
CO2: 27 meq/L (ref 19–32)
Calcium: 9.3 mg/dL (ref 8.4–10.5)
Chloride: 102 meq/L (ref 96–112)
Creatinine, Ser: 0.94 mg/dL (ref 0.40–1.20)
GFR: 69.31 mL/min (ref >60.00)
Glucose, Bld: 102 mg/dL — ABNORMAL HIGH (ref 70–99)
Potassium: 3.7 meq/L (ref 3.5–5.1)
Sodium: 136 meq/L (ref 135–145)
Total Bilirubin: 0.4 mg/dL (ref 0.2–1.2)
Total Protein: 8.3 g/dL (ref 6.0–8.3)

## 2023-08-05 LAB — LIPID PANEL
Cholesterol: 210 mg/dL — ABNORMAL HIGH (ref 0–200)
HDL: 47.1 mg/dL (ref >39.00)
LDL Cholesterol: 129 mg/dL — ABNORMAL HIGH (ref 0–99)
NonHDL: 162.56
Total CHOL/HDL Ratio: 4
Triglycerides: 168 mg/dL — ABNORMAL HIGH (ref 0.0–149.0)
VLDL: 33.6 mg/dL (ref 0.0–40.0)

## 2023-08-05 LAB — HEMOGLOBIN A1C: Hgb A1c MFr Bld: 6.1 % (ref 4.6–6.5)

## 2023-08-05 NOTE — Patient Instructions (Signed)
Stop by the lab prior to leaving today. I will notify you of your results once received.   [facility] to schedule your mammogram.   It was a pleasure to see you today!   

## 2023-08-05 NOTE — Progress Notes (Signed)
Subjective:    Patient ID: Barbara Vasquez, female    DOB: 1971/08/22, 52 y.o.   MRN: [id number]  HPI  Barbara Vasquez is a very pleasant 53 y.o. female with a history of hypertension, hyperlipidemia, prediabetes, MDD, who presents today for complete physical and follow up of chronic conditions.  Immunizations: -Tetanus: Completed in 2014 -Influenza:  -Shingles: Never completed  Diet: Fair diet.  Exercise: No regular exercise.  Eye exam: Completes annually  Dental exam: Completes semi-annually    Pap Smear: Hysterectomy Mammogram: Completed last in 2022  Colonoscopy: Completed in 2022 due 2032   BP Readings from Last 3 Encounters:  08/05/23 110/80  06/28/23 (!) 124/94  06/13/23 132/86      Review of Systems  Constitutional:  Negative for unexpected weight change.  HENT:  Negative for rhinorrhea.   Respiratory:  Negative for cough and shortness of breath.   Cardiovascular:  Negative for chest pain.  Gastrointestinal:  Negative for constipation and diarrhea.  Genitourinary:  Negative for difficulty urinating.  Musculoskeletal:  Negative for arthralgias and myalgias.  Skin:  Negative for rash.  Allergic/Immunologic: Negative for environmental allergies.  Neurological:  Positive for headaches. Negative for dizziness.  Psychiatric/Behavioral:  The patient is not nervous/anxious.          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 03/05/2020   Essential hypertension    Flank pain 01/01/2021   Heart murmur    as child   Hypercholesteremia    Left-sided low back pain with left-sided sciatica 07/28/2016   Pain of left hand 02/04/2020   TFC (triangular fibrocartilage complex) injury 05/06/2020    Social History   Socioeconomic History   Marital status: Single    Spouse name: Not on file   Number of children: Not on file   Years of education: Not on file   Highest education level:  Some college, no degree  Occupational History   Not on file  Tobacco Use   Smoking status: Never   Smokeless tobacco: Never  Vaping Use   Vaping status: Never Used  Substance and Sexual Activity   Alcohol use: Never   Drug use: No   Sexual activity: Yes    Birth control/protection: Surgical  Other Topics Concern   Not on file  Social History Narrative   Moved from [place].   2 children.  5 grandchildren.   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   Social Determinants of Health   Financial Resource Strain: Medium Risk (08/02/2023)   Overall Financial Resource Strain (CARDIA)    Difficulty of Paying Living Expenses: Somewhat hard  Food Insecurity: No Food Insecurity (08/02/2023)   Hunger Vital Sign    Worried About Running Out of Food in the Last Year: Never true    Ran Out of Food in the Last Year: Never true  Transportation Needs: No Transportation Needs (08/02/2023)   PRAPARE - Administrator, Civil Service (Medical): No    Lack of Transportation (Non-Medical): No  Physical Activity: Sufficiently Active (08/02/2023)   Exercise Vital Sign    Days of Exercise per Week: 4 days    Minutes of Exercise per Session: 60 min  Stress: No Stress Concern Present (08/02/2023)   [employer] of Occupational Health - Occupational Stress Questionnaire    Feeling of Stress : Only a little  Social Connections: Socially Isolated (08/02/2023)   Social  Connection and Isolation Panel [NHANES]    Frequency of Communication with Friends and Family: More than three times a week    Frequency of Social Gatherings with Friends and Family: Three times a week    Attends Religious Services: Never    Active Member of Clubs or Organizations: No    Attends Engineer, structural: Not on file    Marital Status: Never married  Intimate Partner Violence: Not on file    Past Surgical History:  Procedure Laterality Date   ABDOMINAL HYSTERECTOMY      CHOLECYSTECTOMY     COLONOSCOPY WITH PROPOFOL N/A 12/04/2020   Procedure: COLONOSCOPY WITH PROPOFOL;  Surgeon: Toney Reil, MD;  Location: [facility] ENDOSCOPY;  Service: Gastroenterology;  Laterality: N/A;   HAND TENDON SURGERY Left 04/01/2020   WRIST ARTHROSCOPY Left 12/11/2019   with  TFC debridment, ulanr shortening osteotomy    Family History  Problem Relation Age of Onset   Hypertension Father    Heart murmur Father    Hypertension Sister    Diabetes Maternal Aunt    Breast cancer Paternal Grandmother 16    Allergies  Allergen Reactions   Penicillins Swelling, Hives and Other (See Comments)    Swelling of the throat Swelling of the throat, chest pain    Current Outpatient Medications on File Prior to Visit  Medication Sig Dispense Refill   amLODipine (NORVASC) 10 MG tablet Take 1 tablet by mouth once daily for blood pressure 90 tablet 0   rosuvastatin (CRESTOR) 20 MG tablet Take 1 tablet (20 mg total) by mouth daily. for cholesterol. 90 tablet 3   hydrochlorothiazide (HYDRODIURIL) 25 MG tablet Take 1 tablet (25 mg total) by mouth daily. 90 tablet 3   No current facility-administered medications on file prior to visit.    BP 110/80 (BP Location: Left Arm, Patient Position: Sitting, Cuff Size: Large)   Pulse 90   Temp 98.4 F (36.9 C)   Ht 5\' 5"  (1.651 m)   Wt 230 lb (104.3 kg)   SpO2 97%   BMI 38.27 kg/m  Objective:   Physical Exam HENT:     Right Ear: Tympanic membrane and ear canal normal.     Left Ear: Tympanic membrane and ear canal normal.  Eyes:     Pupils: Pupils are equal, round, and reactive to light.  Cardiovascular:     Rate and Rhythm: Normal rate and regular rhythm.  Pulmonary:     Effort: Pulmonary effort is normal.     Breath sounds: Normal breath sounds.  Abdominal:     General: Bowel sounds are normal.     Palpations: Abdomen is soft.     Tenderness: There is no abdominal tenderness.  Musculoskeletal:        General: Normal range of  motion.     Cervical back: Neck supple.  Skin:    General: Skin is warm and dry.  Neurological:     Mental Status: She is alert and oriented to person, place, and time.     Cranial Nerves: No cranial nerve deficit.     Deep Tendon Reflexes:     Reflex Scores:      Patellar reflexes are 2+ on the right side and 2+ on the left side. Psychiatric:        Mood and Affect: Mood normal.           Assessment & Plan:  Essential hypertension Assessment & Plan: Controlled.  Continue amlodipine 10 mg daily and hydrochlorothiazide  25 mg daily. CMP pending.  Orders: -     Comprehensive metabolic panel  Hyperlipidemia, unspecified hyperlipidemia type Assessment & Plan: Repeat lipid panel pending. Continue rosuvastatin 20 mg daily.  Discussed the importance of a healthy diet and regular exercise in order for weight loss, and to reduce the risk of further co-morbidity.   Orders: -     Lipid panel  Prediabetes Assessment & Plan: Repeat A1C pending.  Discussed the importance of a healthy diet and regular exercise in order for weight loss, and to reduce the risk of further co-morbidity.   Orders: -     Hemoglobin A1c  Mild episode of recurrent major depressive disorder (HCC) Assessment & Plan: Controlled overall.  No concerns today. Continue to monitor.    Preventative health care Assessment & Plan: Declines Shingrix and influenza vaccines  Mammogram due, orders placed. Colonoscopy UTD, due 2032  Discussed the importance of a healthy diet and regular exercise in order for weight loss, and to reduce the risk of further co-morbidity.  Exam stable. Labs pending.  Follow up in 1 year for repeat physical.          Doreene Nest, NP

## 2023-08-05 NOTE — Assessment & Plan Note (Signed)
Controlled overall.  No concerns today. Continue to monitor.

## 2023-08-05 NOTE — Assessment & Plan Note (Signed)
Declines Shingrix and influenza vaccines  Mammogram due, orders placed. Colonoscopy UTD, due 2032  Discussed the importance of a healthy diet and regular exercise in order for weight loss, and to reduce the risk of further co-morbidity.  Exam stable. Labs pending.  Follow up in 1 year for repeat physical.

## 2023-08-05 NOTE — Assessment & Plan Note (Signed)
Controlled.  Continue amlodipine 10 mg daily and hydrochlorothiazide 25 mg daily. CMP pending.

## 2023-08-05 NOTE — Assessment & Plan Note (Signed)
Repeat A1C pending.  Discussed the importance of a healthy diet and regular exercise in order for weight loss, and to reduce the risk of further co-morbidity.  

## 2023-08-05 NOTE — Assessment & Plan Note (Addendum)
Repeat lipid panel pending. Continue rosuvastatin 20 mg daily.  Discussed the importance of a healthy diet and regular exercise in order for weight loss, and to reduce the risk of further co-morbidity.  

## 2023-08-12 ENCOUNTER — Ambulatory Visit
Admission: RE | Admit: 2023-08-12 | Discharge: 2023-08-12 | Disposition: A | Discharge status: Home / Self Care | Payer: BC Managed Care – PPO | Source: Ambulatory Visit | Attending: Primary Care | Admitting: Primary Care

## 2023-08-12 DIAGNOSIS — Z1231 Encounter for screening mammogram for malignant neoplasm of breast: Secondary | ICD-10-CM | POA: Insufficient documentation

## 2023-09-30 ENCOUNTER — Other Ambulatory Visit: Payer: Self-pay | Admitting: Primary Care

## 2023-09-30 DIAGNOSIS — E785 Hyperlipidemia, unspecified: Secondary | ICD-10-CM

## 2023-10-28 ENCOUNTER — Other Ambulatory Visit: Payer: Self-pay | Admitting: Primary Care

## 2023-10-28 DIAGNOSIS — I10 Essential (primary) hypertension: Secondary | ICD-10-CM

## 2023-11-04 ENCOUNTER — Ambulatory Visit: Payer: Self-pay | Admitting: Primary Care

## 2023-11-04 DIAGNOSIS — M25552 Pain in left hip: Secondary | ICD-10-CM | POA: Diagnosis not present

## 2023-11-04 NOTE — Telephone Encounter (Signed)
 Noted, will evaluate.

## 2023-11-04 NOTE — Telephone Encounter (Signed)
 Copied from CRM [id number]. Topic: Clinical - Red Word Triage >> Nov 04, 2023  8:17 AM Susanna ORN wrote: Red Word that prompted transfer to Nurse Triage: Patient states left hip has been hurting since November. States she couldn't sleep on her left side last night. States it feels a little swollen. Feels as if she's sleeping on something hard.   Chief Complaint: hip pain Symptoms: 8/10 aching pain to hip, radiates to buttocks and a bit to abdomen that side Frequency: continual Pertinent Negatives: Patient denies fever, rash, numbness, tingling, rash, not noticeably swollen Disposition: [] ED /[x] Urgent Care (no appt availability in office) / [] Appointment(In office/virtual)/ []  [facility]/ [] Home Care/ [] Refused Recommended Disposition /[] [place] [facility]/ [x]  Follow-up with PCP Additional Notes: Pt reporting hip pain to left hip since right before Thanksgiving, which has been more acute in past few days. Pt reporting that she first thought it was her hx of sciatica but pain not shooting down leg with current pain. Pt reporting 8/10 aching pain currently to hip which radiates to buttocks and abdomen, mostly buttcheek and hip. Pt reporting she can still walk but hard to walk if she sits down too long which makes the pain worse. Pt reporting she regularly exercises but no injury to her knowledge. Advised pt be seen in next 4 hours, scheduled follow-up with PCP per pt preference, advised UC today since no availability with PCP today. Pt verbalized understanding.  Reason for Disposition  [1] SEVERE pain (e.g., excruciating, unable to do any normal activities) AND [2] not improved after 2 hours of pain medicine  Answer Assessment - Initial Assessment Questions 1. LOCATION and RADIATION: Where is the pain located?      Left hip, moves to abdomen as well, mostly buttcheek and hip, denies shooting down leg 2. QUALITY: What does the pain feel like?  (e.g., sharp, dull, aching,  burning)     Aching mostly 3. SEVERITY: How bad is the pain? What does it keep you from doing?   (Scale 1-10; or mild, moderate, severe)   -  MILD (1-3): doesn't interfere with normal activities    -  MODERATE (4-7): interferes with normal activities (e.g., work or school) or awakens from sleep, limping    -  SEVERE (8-10): excruciating pain, unable to do any normal activities, unable to walk     8/10 right now, can still walk but hard to walk if sit down too long 4. ONSET: When did the pain start? Does it come and go, or is it there all the time?     Right before Thanksgiving, few days ago worsened 5. WORK OR EXERCISE: Has there been any recent work or exercise that involved this part of the body?      Denies, does regular exercise 6. CAUSE: What do you think is causing the hip pain?      Thought sciatic nerve but would've gone away by now, usually leaves after a day or 2 7. AGGRAVATING FACTORS: What makes the hip pain worse? (e.g., walking, climbing stairs, running)     Sitting long time 8. OTHER SYMPTOMS: Do you have any other symptoms? (e.g., back pain, pain shooting down leg,  fever, rash)     Denies fever, rash, numbness, tingling, rash, feels like a little swollen, not noticeably swollen  Protocols used: Hip Pain-A-AH

## 2023-11-04 NOTE — Telephone Encounter (Signed)
   Call already sent to access nurse.

## 2023-11-09 ENCOUNTER — Ambulatory Visit: Payer: BC Managed Care – PPO | Admitting: Family Medicine

## 2023-11-11 ENCOUNTER — Ambulatory Visit
Admission: RE | Admit: 2023-11-11 | Discharge: 2023-11-11 | Disposition: A | Discharge status: Home / Self Care | Payer: BC Managed Care – PPO | Source: Ambulatory Visit | Attending: Primary Care

## 2023-11-11 ENCOUNTER — Encounter: Payer: Self-pay | Admitting: Primary Care

## 2023-11-11 ENCOUNTER — Ambulatory Visit: Payer: BC Managed Care – PPO | Admitting: Primary Care

## 2023-11-11 VITALS — BP 124/88 | HR 87 | Temp 97.7°F | Ht 65.0 in | Wt 232.0 lb

## 2023-11-11 DIAGNOSIS — M25552 Pain in left hip: Secondary | ICD-10-CM

## 2023-11-11 HISTORY — DX: Pain in left hip: M25.552

## 2023-11-11 MED ORDER — PREDNISONE 20 MG PO TABS
ORAL_TABLET | ORAL | 0 refills | Status: DC
Start: 2023-11-11 — End: 2024-02-23

## 2023-11-11 NOTE — Progress Notes (Signed)
 Subjective:    Patient ID: Barbara Vasquez, female    DOB: [birth year], 53 y.o.   MRN: [id number]  Hip Pain  Pertinent negatives include no numbness.    Barbara Vasquez is a very pleasant 53 y.o. female with a history of hypertension, degeneration of lumbar intervertebral disc, carpometacarpal arthritis of left thumb, prediabetes, chronic wrist pain, hyperlipidemia who presents today to discuss hip pain.  Her pain is located to the left lateral hip which began just prior to Thanksgiving. She has chronic lower back pain, but this pain feels different and is in a different location. Her pain is worse with standing after sitting for a long time, or when sleeping on her left side.   She denies injury/trauma, overuse, left buttocks, numbness to lower extremity.   She has a sedentary job, works at [employer]. She's been taking OTC Aleve  and Tylenol  Arthritis without improvement.   She presented to [facility] on 11/04/23 for her pain. She was treated with cyclobenzaprine  10 mg TID PRN and Naproxen  500 mg BID PRN. She's been taking these medications which have not helped with her pain.    Review of Systems  Musculoskeletal:  Positive for arthralgias and back pain.  Skin:  Negative for color change.  Neurological:  Negative for weakness and numbness.         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 03/05/2020   Essential hypertension    Flank pain 01/01/2021   Heart murmur    as child   Hypercholesteremia    Left-sided low back pain with left-sided sciatica 07/28/2016   Pain of left hand 02/04/2020   TFC (triangular fibrocartilage complex) injury 05/06/2020    Social History   Socioeconomic History   Marital status: Single    Spouse name: Not on file   Number of children: Not on file   Years of education: Not on file   Highest education level: Some college, no degree  Occupational History   Not  on file  Tobacco Use   Smoking status: Never   Smokeless tobacco: Never  Vaping Use   Vaping status: Never Used  Substance and Sexual Activity   Alcohol use: Never   Drug use: No   Sexual activity: Yes    Birth control/protection: Surgical  Other Topics Concern   Not on file  Social History Narrative   Moved from [place] .   2 children.  5 grandchildren.   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   Social Drivers of Health   Financial Resource Strain: Low Risk  (11/08/2023)   Overall Financial Resource Strain (CARDIA)    Difficulty of Paying Living Expenses: Not hard at all  Food Insecurity: No Food Insecurity (11/08/2023)   Hunger Vital Sign    Worried About Running Out of Food in the Last Year: Never true    Ran Out of Food in the Last Year: Never true  Transportation Needs: No Transportation Needs (11/08/2023)   PRAPARE - Administrator, Civil Service (Medical): No    Lack of Transportation (Non-Medical): No  Physical Activity: Insufficiently Active (11/08/2023)   Exercise Vital Sign    Days of Exercise per Week: 3 days    Minutes of Exercise per Session: 30 min  Stress: No Stress Concern Present (11/08/2023)   [employer] of Occupational Health - Occupational Stress Questionnaire    Feeling  of Stress : Only a little  Social Connections: Moderately Isolated (11/08/2023)   Social Connection and Isolation Panel [NHANES]    Frequency of Communication with Friends and Family: Three times a week    Frequency of Social Gatherings with Friends and Family: Three times a week    Attends Religious Services: 1 to 4 times per year    Active Member of Clubs or Organizations: No    Attends Engineer, Structural: Not on file    Marital Status: Never married  Intimate Partner Violence: Not on file    Past Surgical History:  Procedure Laterality Date   ABDOMINAL HYSTERECTOMY     CHOLECYSTECTOMY     COLONOSCOPY WITH PROPOFOL  N/A  12/04/2020   Procedure: COLONOSCOPY WITH PROPOFOL ;  Surgeon: Unk Corinn Skiff, MD;  Location: [facility] ENDOSCOPY;  Service: Gastroenterology;  Laterality: N/A;   HAND TENDON SURGERY Left 04/01/2020   WRIST ARTHROSCOPY Left 12/11/2019   with  TFC debridment, ulanr shortening osteotomy    Family History  Problem Relation Age of Onset   Hypertension Father    Heart murmur Father    Hypertension Sister    Diabetes Maternal Aunt    Breast cancer Paternal Grandmother 63    Allergies  Allergen Reactions   Penicillins Swelling, Hives and Other (See Comments)    Swelling of the throat Swelling of the throat, chest pain    Current Outpatient Medications on File Prior to Visit  Medication Sig Dispense Refill   amLODipine  (NORVASC ) 10 MG tablet Take 1 tablet by mouth once daily for blood pressure 90 tablet 2   rosuvastatin  (CRESTOR ) 20 MG tablet TAKE 1 TABLET BY MOUTH ONCE DAILY FOR CHOLESTEROL 90 tablet 2   hydrochlorothiazide  (HYDRODIURIL ) 25 MG tablet Take 1 tablet (25 mg total) by mouth daily. 90 tablet 3   No current facility-administered medications on file prior to visit.    BP 124/88   Pulse 87   Temp 97.7 F (36.5 C) (Temporal)   Ht 5' 5 (1.651 m)   Wt 232 lb (105.2 kg)   SpO2 99%   BMI 38.61 kg/m  Objective:   Physical Exam Cardiovascular:     Rate and Rhythm: Normal rate and regular rhythm.  Pulmonary:     Effort: Pulmonary effort is normal.  Musculoskeletal:     Cervical back: Neck supple.     Right hip: Normal range of motion. Normal strength.     Left hip: Normal range of motion. Normal strength.     Comments: Mild pain to left lateral hip with medial and lateral hip rotation while supine.  Skin:    General: Skin is warm and dry.  Neurological:     Mental Status: She is alert and oriented to person, place, and time.  Psychiatric:        Mood and Affect: Mood normal.           Assessment & Plan:  Acute hip pain, left Assessment & Plan: Symptoms  suggestive of arthritis, especially given her overall sedentary lifestyle.  Discussed the importance of increasing physical activity and stretching.  Checking plain films of the left hip today. Offered physical therapy for which she declines. Offered Toradol  intramuscularly in the office today for which she declines.  Start prednisone  tablets. Take two tablets my mouth once daily in the morning for four days, then one tablet once daily in the morning for four days.   Consider orthopedic referral if needed. She is on statin therapy, consider holding  temporarily.  Orders: -     predniSONE ; Take two tablets my mouth once daily in the morning for four days, then one tablet once daily in the morning for four days.  Dispense: 12 tablet; Refill: 0 -     DG HIP UNILAT W OR W/O PELVIS 2-3 VIEWS LEFT        Comer MARLA Gaskins, NP

## 2023-11-11 NOTE — Patient Instructions (Signed)
 Start prednisone  tablets. Take two tablets my mouth once daily in the morning for four days, then one tablet once daily in the morning for four days.   Increase your walking and stretching as discussed.  Complete xray(s) prior to leaving today. I will notify you of your results once received.  It was a pleasure to see you today!

## 2023-11-11 NOTE — Assessment & Plan Note (Addendum)
 Symptoms suggestive of arthritis, especially given her overall sedentary lifestyle.  Discussed the importance of increasing physical activity and stretching.  Checking plain films of the left hip today. Offered physical therapy for which she declines. Offered Toradol  intramuscularly in the office today for which she declines.  Start prednisone  tablets. Take two tablets my mouth once daily in the morning for four days, then one tablet once daily in the morning for four days.   Consider orthopedic referral if needed. She is on statin therapy, consider holding temporarily.

## 2024-02-23 ENCOUNTER — Encounter: Payer: Self-pay | Admitting: Primary Care

## 2024-02-23 ENCOUNTER — Ambulatory Visit (INDEPENDENT_AMBULATORY_CARE_PROVIDER_SITE_OTHER): Admitting: Primary Care

## 2024-02-23 VITALS — BP 124/84 | HR 100 | Temp 98.0°F | Ht 64.5 in | Wt 227.5 lb

## 2024-02-23 DIAGNOSIS — R7303 Prediabetes: Secondary | ICD-10-CM

## 2024-02-23 DIAGNOSIS — F330 Major depressive disorder, recurrent, mild: Secondary | ICD-10-CM

## 2024-02-23 DIAGNOSIS — F411 Generalized anxiety disorder: Secondary | ICD-10-CM

## 2024-02-23 DIAGNOSIS — R609 Edema, unspecified: Secondary | ICD-10-CM

## 2024-02-23 LAB — HEPATIC FUNCTION PANEL
ALT: 26 U/L (ref 0–35)
AST: 22 U/L (ref 0–37)
Albumin: 4.3 g/dL (ref 3.5–5.2)
Alkaline Phosphatase: 108 U/L (ref 39–117)
Bilirubin, Direct: 0.1 mg/dL (ref 0.0–0.3)
Total Bilirubin: 0.6 mg/dL (ref 0.2–1.2)
Total Protein: 8.2 g/dL (ref 6.0–8.3)

## 2024-02-23 LAB — BRAIN NATRIURETIC PEPTIDE: Pro B Natriuretic peptide (BNP): 9 pg/mL (ref 0.0–100.0)

## 2024-02-23 LAB — HEMOGLOBIN A1C: Hgb A1c MFr Bld: 6.2 % (ref 4.6–6.5)

## 2024-02-23 MED ORDER — ESCITALOPRAM OXALATE 10 MG PO TABS
10.0000 mg | ORAL_TABLET | Freq: Every day | ORAL | 0 refills | Status: DC
Start: 2024-02-23 — End: 2024-05-21

## 2024-02-23 NOTE — Assessment & Plan Note (Signed)
 Uncontrolled.  Zoloft  and therapy ineffective.  Start Lexapro  10 mg daily.  Discussed potential side effects and instructions for start. She will update in about 4 weeks.

## 2024-02-23 NOTE — Progress Notes (Signed)
 Subjective:    Patient ID: Barbara Vasquez, female    DOB: 1971-03-29, 53 y.o.   MRN: [id number]  HPI  Barbara Vasquez is a very pleasant 53 y.o. female with a history of hypertension, hyperlipidemia, prediabetes who presents today to discuss body swelling and anxiety.   1) Body Swelling: She's noticed tightness and swelling to her abdomen, bilateral feet, bilateral hands. She leads a sedentary lifestyle, works at desk in the office, snacks frequently during the day. She has increased intake of water. Trying to cut back on eating junk food.   She eats fast food and fried food 3-4 days weekly. She is walking during her lunch break, tries to get 5000 steps during her work week, gets 15,000 steps on Saturdays.   She has a daily bowel movement.   She denies chest pain, increased shortness of breath.   2) GAD/MDD: Chronic for years. Symptoms include irritability, feeling overwhelmed, palpitations, chest tightness, worrying, sometimes little motivation to do things.   She was once managed on Zoloft  in the past, not effective. She's tried therapy multiple times in the past without improvement. She is open to trying something else for anxiety.   BP Readings from Last 3 Encounters:  02/23/24 124/84  11/11/23 124/88  08/05/23 110/80   Wt Readings from Last 3 Encounters:  02/23/24 227 lb 8 oz (103.2 kg)  11/11/23 232 lb (105.2 kg)  08/05/23 230 lb (104.3 kg)      Review of Systems  Respiratory:  Negative for shortness of breath.   Cardiovascular:  Negative for chest pain.  Gastrointestinal:  Negative for constipation.  Psychiatric/Behavioral:  The patient is nervous/anxious.          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 03/05/2020   Essential hypertension    Flank pain 01/01/2021   Heart murmur    as child   Hypercholesteremia    Left-sided low back pain with  left-sided sciatica 07/28/2016   Pain of left hand 02/04/2020   TFC (triangular fibrocartilage complex) injury 05/06/2020    Social History   Socioeconomic History   Marital status: Single    Spouse name: Not on file   Number of children: Not on file   Years of education: Not on file   Highest education level: Some college, no degree  Occupational History   Not on file  Tobacco Use   Smoking status: Never   Smokeless tobacco: Never  Vaping Use   Vaping status: Never Used  Substance and Sexual Activity   Alcohol use: Never   Drug use: No   Sexual activity: Yes    Birth control/protection: Surgical  Other Topics Concern   Not on file  Social History Narrative   Moved from [place] .   2 children.  5 grandchildren.   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   Social Drivers of Health   Financial Resource Strain: Low Risk  (11/08/2023)   Overall Financial Resource Strain (CARDIA)    Difficulty of Paying Living Expenses: Not hard at all  Food Insecurity: No Food Insecurity (11/08/2023)   Hunger Vital Sign    Worried About Running Out of Food in the Last Year: Never true    Ran Out of Food in the Last Year: Never true  Transportation Needs: No Transportation Needs (11/08/2023)   PRAPARE - Transportation  Lack of Transportation (Medical): No    Lack of Transportation (Non-Medical): No  Physical Activity: Insufficiently Active (11/08/2023)   Exercise Vital Sign    Days of Exercise per Week: 3 days    Minutes of Exercise per Session: 30 min  Stress: No Stress Concern Present (11/08/2023)   [employer] of Occupational Health - Occupational Stress Questionnaire    Feeling of Stress : Only a little  Social Connections: Moderately Isolated (11/08/2023)   Social Connection and Isolation Panel [NHANES]    Frequency of Communication with Friends and Family: Three times a week    Frequency of Social Gatherings with Friends and Family: Three times a week     Attends Religious Services: 1 to 4 times per year    Active Member of Clubs or Organizations: No    Attends Engineer, structural: Not on file    Marital Status: Never married  Intimate Partner Violence: Not on file    Past Surgical History:  Procedure Laterality Date   ABDOMINAL HYSTERECTOMY     CHOLECYSTECTOMY     COLONOSCOPY WITH PROPOFOL  N/A 12/04/2020   Procedure: COLONOSCOPY WITH PROPOFOL ;  Surgeon: Selena Daily, MD;  Location: [facility] END[facility]COPY;  Service: Gastroenterology;  Laterality: N/A;   HAND TENDON SURGERY Left 04/01/2020   WRIST ARTHROSCOPY Left 12/11/2019   with  TFC debridment, ulanr shortening osteotomy    Family History  Problem Relation Age of Onset   Hypertension Father    Heart murmur Father    Hypertension Sister    Diabetes Maternal Aunt    Breast cancer Paternal Grandmother 63    Allergies  Allergen Reactions   Penicillins Swelling, Hives and Other (See Comments)    Swelling of the throat Swelling of the throat, chest pain    Current Outpatient Medications on File Prior to Visit  Medication Sig Dispense Refill   amLODipine  (NORVASC ) 10 MG tablet Take 1 tablet by mouth once daily for blood pressure 90 tablet 2   hydrochlorothiazide  (HYDRODIURIL ) 25 MG tablet Take 1 tablet (25 mg total) by mouth daily. 90 tablet 3   rosuvastatin  (CRESTOR ) 20 MG tablet TAKE 1 TABLET BY MOUTH ONCE DAILY FOR CHOLESTEROL 90 tablet 2   No current facility-administered medications on file prior to visit.    BP 124/84   Pulse 100   Temp 98 F (36.7 C) (Oral)   Ht 5' 4.5" (1.638 m)   Wt 227 lb 8 oz (103.2 kg)   SpO2 95%   BMI 38.45 kg/m  Objective:   Physical Exam Cardiovascular:     Rate and Rhythm: Normal rate and regular rhythm.  Pulmonary:     Effort: Pulmonary effort is normal.     Breath sounds: Normal breath sounds.  Abdominal:     Comments: No abdominal distention, just large abdominal girth.   Musculoskeletal:     Cervical back: Neck  supple.  Skin:    General: Skin is warm and dry.  Neurological:     Mental Status: She is alert and oriented to person, place, and time.  Psychiatric:        Mood and Affect: Mood normal.           Assessment & Plan:  GAD (generalized anxiety disorder) Assessment & Plan: Uncontrolled.  Zoloft  and therapy ineffective.  Start Lexapro  10 mg daily.  Discussed potential side effects and instructions for start. She will update in about 4 weeks.  Orders: -     Escitalopram  Oxalate; Take 1 tablet (  10 mg total) by mouth daily. for anxiety and depression.  Dispense: 90 tablet; Refill: 0  Mild episode of recurrent major depressive disorder (HCC) Assessment & Plan: Uncontrolled.  Zoloft  and therapy ineffective.  Start Lexapro  10 mg daily.  Discussed potential side effects and instructions for start. She will update in about 4 weeks.  Orders: -     Escitalopram  Oxalate; Take 1 tablet (10 mg total) by mouth daily. for anxiety and depression.  Dispense: 90 tablet; Refill: 0  Swelling of body region Assessment & Plan: Suspect this is largely secondary to her sedentary lifestyle and overall diet. We discussed that fast food and takeout contains quite a bit of salt and unhealthy calories.  Recommended she discontinue takeout/fast food. Continue with regular activity. Food journaling.  Checking labs today including BNP, liver enzymes, A1c.  Offered referral to healthy weight wellness center in [place], she declines for now but will update later.  Orders: -     Brain natriuretic peptide -     Hepatic function panel  Prediabetes -     Hemoglobin A1c        Gabriel John, NP

## 2024-02-23 NOTE — Patient Instructions (Signed)
 Stop by the lab prior to leaving today. I will notify you of your results once received.   Please notify me if you are interested in a referral to healthy weight and wellness center in [place].  Start Lexapro  10 mg for anxiety and depression. Take 1/2 tablet by mouth once daily for about one week, then increase to 1 full tablet thereafter.   Please update me in about 4 weeks regarding your anxiety/depression.  It was a pleasure to see you today!

## 2024-02-23 NOTE — Assessment & Plan Note (Addendum)
 Suspect this is largely secondary to her sedentary lifestyle and overall diet. We discussed that fast food and takeout contains quite a bit of salt and unhealthy calories.  Recommended she discontinue takeout/fast food. Continue with regular activity. Food journaling.  Checking labs today including BNP, liver enzymes, A1c.  Offered referral to healthy weight wellness center in [place], she declines for now but will update later.

## 2024-02-24 ENCOUNTER — Encounter: Admitting: Primary Care

## 2024-04-19 ENCOUNTER — Encounter: Payer: Self-pay | Admitting: Primary Care

## 2024-04-19 ENCOUNTER — Ambulatory Visit: Admitting: Primary Care

## 2024-04-19 ENCOUNTER — Ambulatory Visit
Admission: RE | Admit: 2024-04-19 | Discharge: 2024-04-19 | Disposition: A | Discharge status: Home / Self Care | Source: Ambulatory Visit | Attending: Primary Care | Admitting: Primary Care

## 2024-04-19 VITALS — BP 128/66 | HR 96 | Temp 97.5°F | Ht 64.5 in | Wt 224.0 lb

## 2024-04-19 DIAGNOSIS — R519 Headache, unspecified: Secondary | ICD-10-CM | POA: Diagnosis not present

## 2024-04-19 DIAGNOSIS — M5442 Lumbago with sciatica, left side: Secondary | ICD-10-CM

## 2024-04-19 DIAGNOSIS — R079 Chest pain, unspecified: Secondary | ICD-10-CM | POA: Diagnosis not present

## 2024-04-19 DIAGNOSIS — B351 Tinea unguium: Secondary | ICD-10-CM | POA: Insufficient documentation

## 2024-04-19 DIAGNOSIS — G8929 Other chronic pain: Secondary | ICD-10-CM | POA: Diagnosis not present

## 2024-04-19 DIAGNOSIS — M47816 Spondylosis without myelopathy or radiculopathy, lumbar region: Secondary | ICD-10-CM | POA: Diagnosis not present

## 2024-04-19 DIAGNOSIS — M5441 Lumbago with sciatica, right side: Secondary | ICD-10-CM | POA: Diagnosis not present

## 2024-04-19 DIAGNOSIS — M549 Dorsalgia, unspecified: Secondary | ICD-10-CM | POA: Diagnosis not present

## 2024-04-19 MED ORDER — PROPRANOLOL HCL ER 80 MG PO CP24: 80.0000 mg | ORAL_CAPSULE | Freq: Every day | ORAL | 0 refills | Status: AC

## 2024-04-19 NOTE — Progress Notes (Signed)
 Subjective:    Patient ID: Barbara Vasquez, female    DOB: Jul 11, 1971, 53 y.o.   MRN: [id number]  HPI  Barbara Vasquez is a very pleasant 53 y.o. female with a history of hypertension, osteoarthritis, hyperlipidemia, prediabetes, chronic wrist pain who presents today to discuss several concerns.  1) Toenail Problem:Chronic history of right great toenail thickening with pain upon cutting her nails. Her right great toenail fell off in her 20s and since then she's had problems. She does notice hyperpigmentation to most all other toes.   Gradually over the years her right great toenail has become worse.   2) Chronic Headaches: Chronic history of headaches which began around childhood, worse around 2019 and since then has had intermittent headaches. Headaches are located to the bilateral frontal lobes with intermittent radiation to occipital lobes for which she gets every other day lasting for 1 hour. She will have bad headaches several times monthly, has to turn off the lights and sleep. She does have photophobia with headaches.   She will take Tylenol  or Ibuprofen several times weekly with improvement. Her last eye exam was in February 2025 and her exam was the same. She has never had a MRI of the brain.   3) Chest Pain: Acute for the last 6 weeks, located to the right chest, right axilla, right upper extremity with numbness. She mostly experiences these symptoms each time she eats, but she will notice these symptoms at rest.   She does have a history of neck and shoulder pain to the right side. She denies breast pain, esophageal burning. Following with cardiology, last visit was in August 2024. Has undergone work up including echocardiogram, coronary CT which was without CAD.   4) Chronic Back Pain: Chronic for years, located to the bilateral lower back with radiation of pain down bilateral lower extremities with numbness to her legs. She is asking for a handicap placard due to  inability to walk long distances because of her back pain. She cannot stand longer than 10 minutes due to her pain.  She has undergone physical therapy for her lower back without improvement.  She did see neurosurgery at 1 point, does not remember the plan.  She is not interested in physical therapy or following up with neurosurgery or orthopedics.  BP Readings from Last 3 Encounters:  04/19/24 128/66  02/23/24 124/84  11/11/23 124/88     Review of Systems  Eyes:  Positive for photophobia.  Cardiovascular:  Positive for chest pain.  Gastrointestinal:  Negative for abdominal pain.  Musculoskeletal:  Positive for back pain. Negative for arthralgias.  Skin:  Negative for color change.       Toenail thickening and discoloration.   Neurological:  Positive for numbness and headaches.         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 03/05/2020   Essential hypertension    Flank pain 01/01/2021   Heart murmur    as child   Hypercholesteremia    Left-sided low back pain with left-sided sciatica 07/28/2016   Pain of left hand 02/04/2020   TFC (triangular fibrocartilage complex) injury 05/06/2020    Social History   Socioeconomic History   Marital status: Single    Spouse name: Not on file   Number of children: Not on file   Years of education: Not on file   Highest education level: Some college, no degree  Occupational History   Not on file  Tobacco Use   Smoking status: Never   Smokeless tobacco: Never  Vaping Use   Vaping status: Never Used  Substance and Sexual Activity   Alcohol use: Never   Drug use: No   Sexual activity: Yes    Birth control/protection: Surgical  Other Topics Concern   Not on file  Social History Narrative   Moved from [place] .   2 children.  5 grandchildren.   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   Social  Drivers of Health   Financial Resource Strain: Low Risk  (11/08/2023)   Overall Financial Resource Strain (CARDIA)    Difficulty of Paying Living Expenses: Not hard at all  Food Insecurity: No Food Insecurity (11/08/2023)   Hunger Vital Sign    Worried About Running Out of Food in the Last Year: Never true    Ran Out of Food in the Last Year: Never true  Transportation Needs: No Transportation Needs (11/08/2023)   PRAPARE - Administrator, Civil Service (Medical): No    Lack of Transportation (Non-Medical): No  Physical Activity: Insufficiently Active (11/08/2023)   Exercise Vital Sign    Days of Exercise per Week: 3 days    Minutes of Exercise per Session: 30 min  Stress: No Stress Concern Present (11/08/2023)   [employer] of Occupational Health - Occupational Stress Questionnaire    Feeling of Stress : Only a little  Social Connections: Moderately Isolated (11/08/2023)   Social Connection and Isolation Panel    Frequency of Communication with Friends and Family: Three times a week    Frequency of Social Gatherings with Friends and Family: Three times a week    Attends Religious Services: 1 to 4 times per year    Active Member of Clubs or Organizations: No    Attends Engineer, structural: Not on file    Marital Status: Never married  Intimate Partner Violence: Not on file    Past Surgical History:  Procedure Laterality Date   ABDOMINAL HYSTERECTOMY     CHOLECYSTECTOMY     COLONOSCOPY WITH PROPOFOL  N/A 12/04/2020   Procedure: COLONOSCOPY WITH PROPOFOL ;  Surgeon: Selena Daily, MD;  Location: [facility] ENDOSCOPY;  Service: Gastroenterology;  Laterality: N/A;   HAND TENDON SURGERY Left 04/01/2020   WRIST ARTHROSCOPY Left 12/11/2019   with  TFC debridment, ulanr shortening osteotomy    Family History  Problem Relation Age of Onset   Hypertension Father    Heart murmur Father    Hypertension Sister    Diabetes Maternal Aunt    Breast cancer Paternal  Grandmother 41    Allergies  Allergen Reactions   Penicillins Swelling, Hives and Other (See Comments)    Swelling of the throat Swelling of the throat, chest pain    Current Outpatient Medications on File Prior to Visit  Medication Sig Dispense Refill   amLODipine  (NORVASC ) 10 MG tablet Take 1 tablet by mouth once daily for blood pressure 90 tablet 2   escitalopram  (LEXAPRO ) 10 MG tablet Take 1 tablet (10 mg total) by mouth daily. for anxiety and depression. 90 tablet 0   hydrochlorothiazide  (HYDRODIURIL ) 25 MG tablet Take 1 tablet (25 mg total) by mouth daily. 90 tablet 3   rosuvastatin  (CRESTOR ) 20 MG tablet TAKE 1 TABLET BY MOUTH ONCE DAILY FOR CHOLESTEROL 90 tablet 2   No current facility-administered medications on file prior to visit.  BP 128/66   Pulse 96   Temp (!) 97.5 F (36.4 C) (Temporal)   Ht 5' 4.5 (1.638 m)   Wt 224 lb (101.6 kg)   SpO2 97%   BMI 37.86 kg/m  Objective:   Physical Exam  Cardiovascular:     Rate and Rhythm: Normal rate.  Pulmonary:     Effort: Pulmonary effort is normal.   Musculoskeletal:     Cervical back: Neck supple.     Lumbar back: No bony tenderness. Normal range of motion. Negative right straight leg raise test and negative left straight leg raise test.       Back:   Skin:    General: Skin is warm and dry.     Comments: Hyperpigmented toenails. Right great toenail with thickened yellow presentation   Neurological:     Mental Status: She is alert and oriented to person, place, and time.   Psychiatric:        Mood and Affect: Mood normal.           Assessment & Plan:  Onychomycosis Assessment & Plan: Evident to most all toenails bilaterally.  Offered prescription for terbinafine for which she declines. Referral placed to podiatry.  Orders: -     Ambulatory referral to Podiatry  Frequent headaches Assessment & Plan: Headaches do sound migraine related.  We discussed options for treatment.  Start  propranolol ER 80 mg at bedtime for headache and tension. MRI brain ordered and pending.  Orders: -     MR BRAIN WO CONTRAST; Future -     Propranolol HCl ER; Take 1 capsule (80 mg total) by mouth at bedtime. For headache prevention  Dispense: 90 capsule; Refill: 0  Chronic bilateral low back pain with bilateral sciatica Assessment & Plan: No alarm signs on exam.  Will update plain films of the lumbar spine today.  She declines physical therapy and referral to orthopedics. Will consider handicap placard once x-ray results return.  Orders: -     DG Lumbar Spine Complete  Chest pain, unspecified type Assessment & Plan: Likely musculoskeletal versus GERD.  Does not seem to be breast related. We discussed to take Tums during the next episode to see if this helps to relieve her symptoms. 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  Reviewed cardiology notes from August 2024.      40 minutes spent with patient face to face discussing multiple concerns, chart review, ordering testing and placing referral.  See assessment and plan for documentation.   Nadiyah Zeis K Zacchaeus Halm, NP

## 2024-04-19 NOTE — Telephone Encounter (Signed)
 PPW handed to [place].

## 2024-04-19 NOTE — Telephone Encounter (Signed)
 Type of forms received: handicap parking placard   Routed to: [employer]   Paperwork received by : Alena An   Individual made aware of 3-5 business day turn around (Y/N): Y   Form completed and patient made aware of charges(Y/N): Y    Faxed to : pt requested cb once form is comp @ [phone]  Form location:  [facility] folder up front

## 2024-04-19 NOTE — Assessment & Plan Note (Signed)
 Headaches do sound migraine related.  We discussed options for treatment.  Start propranolol ER 80 mg at bedtime for headache and tension. MRI brain ordered and pending.

## 2024-04-19 NOTE — Patient Instructions (Signed)
 Start propranolol ER 80 mg for headache prevention.  Take 1 pill by mouth every evening at bedtime.  Complete xray(s) prior to leaving today. I will notify you of your results once received.  You will receive a phone call regarding the MRI of your brain.  You will either be contacted via phone regarding your referral to podiatry, or you may receive a letter on your MyChart portal from our referral team with instructions for scheduling an appointment. Please let us  know if you have not been contacted by anyone within two weeks.  It was a pleasure to see you today!

## 2024-04-19 NOTE — Assessment & Plan Note (Signed)
 Evident to most all toenails bilaterally.  Offered prescription for terbinafine for which she declines. Referral placed to podiatry.

## 2024-04-19 NOTE — Assessment & Plan Note (Signed)
 Likely musculoskeletal versus GERD.  Does not seem to be breast related. We discussed to take Tums during the next episode to see if this helps to relieve her symptoms. 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  Reviewed cardiology notes from August 2024.

## 2024-04-19 NOTE — Assessment & Plan Note (Signed)
 No alarm signs on exam.  Will update plain films of the lumbar spine today.  She declines physical therapy and referral to orthopedics. Will consider handicap placard once x-ray results return.

## 2024-04-30 ENCOUNTER — Ambulatory Visit: Payer: Self-pay | Admitting: Primary Care

## 2024-05-11 ENCOUNTER — Ambulatory Visit
Admission: RE | Admit: 2024-05-11 | Discharge: 2024-05-11 | Disposition: A | Discharge status: Home / Self Care | Source: Ambulatory Visit | Attending: Primary Care

## 2024-05-11 DIAGNOSIS — R519 Headache, unspecified: Secondary | ICD-10-CM | POA: Insufficient documentation

## 2024-05-11 DIAGNOSIS — R9082 White matter disease, unspecified: Secondary | ICD-10-CM | POA: Diagnosis not present

## 2024-05-18 ENCOUNTER — Ambulatory Visit: Admitting: Podiatry

## 2024-05-21 ENCOUNTER — Other Ambulatory Visit: Payer: Self-pay | Admitting: Primary Care

## 2024-05-21 DIAGNOSIS — F330 Major depressive disorder, recurrent, mild: Secondary | ICD-10-CM

## 2024-05-21 DIAGNOSIS — F411 Generalized anxiety disorder: Secondary | ICD-10-CM

## 2024-05-25 ENCOUNTER — Encounter: Payer: Self-pay | Admitting: Podiatry

## 2024-05-25 ENCOUNTER — Ambulatory Visit: Admitting: Podiatry

## 2024-05-25 VITALS — Ht 64.5 in | Wt 224.0 lb

## 2024-05-25 DIAGNOSIS — B351 Tinea unguium: Secondary | ICD-10-CM | POA: Diagnosis not present

## 2024-05-25 DIAGNOSIS — M79674 Pain in right toe(s): Secondary | ICD-10-CM | POA: Diagnosis not present

## 2024-05-25 MED ORDER — TERBINAFINE HCL 250 MG PO TABS: 250.0000 mg | ORAL_TABLET | Freq: Every day | ORAL | 2 refills | Status: DC

## 2024-05-25 NOTE — Progress Notes (Signed)
   Chief Complaint  Patient presents with   Nail Problem    Pt is here due to right great toenail, she states that its weird looking and sometimes hurt while wearing certain shoes. Toenail is thick and deformed.    Subjective: 53 y.o. 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 03/05/2020   Essential hypertension    Flank pain 01/01/2021   Heart murmur    as child   Hypercholesteremia    Left-sided low back pain with left-sided sciatica 07/28/2016   Pain of left hand 02/04/2020   TFC (triangular fibrocartilage complex) injury 05/06/2020    Past Surgical History:  Procedure Laterality Date   ABDOMINAL HYSTERECTOMY     CHOLECYSTECTOMY     COLONOSCOPY WITH PROPOFOL  N/A 12/04/2020   Procedure: COLONOSCOPY WITH PROPOFOL ;  Surgeon: Unk Corinn Skiff, MD;  Location: [facility] ENDOSCOPY;  Service: Gastroenterology;  Laterality: N/A;   HAND TENDON SURGERY Left 04/01/2020   WRIST ARTHROSCOPY Left 12/11/2019   with  TFC debridment, ulanr shortening osteotomy    Allergies  Allergen Reactions   Penicillins Swelling, Hives and Other (See Comments)    Swelling of the throat Swelling of the throat, chest pain    RT great toe 05/25/2024  Objective: Physical Exam General: The patient is alert and oriented x3 in no acute distress.  Dermatology: Hyperkeratotic, discolored, thickened, onychodystrophy noted. Skin is warm, dry and supple bilateral lower extremities. Negative for open lesions or macerations.  Vascular: Palpable pedal pulses bilaterally. No edema or erythema noted. Capillary refill within normal limits.  Neurological: Grossly intact via light touch  Musculoskeletal Exam: No pedal deformity noted  Assessment: #1 Onychomycosis of toenail right hallux  Plan of Care:  -Patient was evaluated. -Today we discussed  different treatment options including oral, topical, and laser antifungal treatment modalities.  We discussed their efficacies and side effects.  Patient opts for oral antifungal treatment modality -Prescription for Lamisil  250 mg #90 daily. Pt denies a history of liver pathology or symptoms.  LFT 02/23/2024 hepatic function WNL -Mechanical debridement of the nail was performed today and back to the healthier base using a nail nipper without incident or bleeding. -Return to clinic 6 months   Thresa EMERSON Sar, DPM [facility]  Dr. Thresa EMERSON Sar, DPM    [address] [place], [place] [address]                Office [phone]  Fax [phone]

## 2024-06-28 ENCOUNTER — Other Ambulatory Visit: Payer: Self-pay | Admitting: Primary Care

## 2024-06-28 DIAGNOSIS — Z1231 Encounter for screening mammogram for malignant neoplasm of breast: Secondary | ICD-10-CM

## 2024-08-14 ENCOUNTER — Other Ambulatory Visit: Payer: Self-pay | Admitting: Medical Genetics

## 2024-08-16 ENCOUNTER — Other Ambulatory Visit: Payer: Self-pay | Admitting: Primary Care

## 2024-08-16 DIAGNOSIS — I10 Essential (primary) hypertension: Secondary | ICD-10-CM

## 2024-08-17 ENCOUNTER — Ambulatory Visit
Admission: RE | Admit: 2024-08-17 | Discharge: 2024-08-17 | Disposition: A | Discharge status: Home / Self Care | Source: Ambulatory Visit | Attending: Primary Care | Admitting: Primary Care

## 2024-08-17 DIAGNOSIS — Z1231 Encounter for screening mammogram for malignant neoplasm of breast: Secondary | ICD-10-CM | POA: Insufficient documentation

## 2024-08-18 ENCOUNTER — Other Ambulatory Visit: Payer: Self-pay | Admitting: Podiatry

## 2024-08-22 ENCOUNTER — Ambulatory Visit: Payer: Self-pay | Admitting: Primary Care

## 2024-08-22 DIAGNOSIS — F411 Generalized anxiety disorder: Secondary | ICD-10-CM

## 2024-08-22 DIAGNOSIS — F330 Major depressive disorder, recurrent, mild: Secondary | ICD-10-CM

## 2024-08-24 ENCOUNTER — Other Ambulatory Visit
Admission: RE | Admit: 2024-08-24 | Discharge: 2024-08-24 | Disposition: A | Discharge status: Home / Self Care | Payer: Self-pay | Source: Ambulatory Visit | Attending: Medical Genetics | Admitting: Medical Genetics

## 2024-09-10 LAB — GENECONNECT MOLECULAR SCREEN: Genetic Analysis Overall Interpretation: POSITIVE — AB

## 2024-09-11 ENCOUNTER — Telehealth: Payer: Self-pay | Admitting: Medical Genetics

## 2024-09-11 DIAGNOSIS — E78019 Familial hypercholesterolemia, unspecified: Secondary | ICD-10-CM

## 2024-09-11 NOTE — Telephone Encounter (Signed)
 [facility] Positive Result Note 09/11/2024 8:20 PM  FIRST ATTEMPT: Confirmed I was speaking with Barbara Vasquez [id number] by using name and DOB. Informed participant the reason for this call is to provide results for the above study. 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 Genetic counseling was offered and participant requests a call back to schedule. All questions were answered, and participant was thanked for their time and support of the above study. Participant was encouraged to contact [facility] if they have any further questions or concerns.

## 2024-09-13 ENCOUNTER — Ambulatory Visit: Admitting: Licensed Clinical Social Worker

## 2024-09-13 NOTE — Progress Notes (Signed)
 A user error has taken place: encounter opened in error, closed for administrative reasons.  Patient made several attempts but was not familiar with how to use their phone for the virtual session.  No session held as time was exhausted waiting for the patient to troubleshoot with no success. Loss of time exiting and reentering the session.  Instructions to contact schedulers for new session in person or to make an attempt at another session using another device.

## 2024-09-20 ENCOUNTER — Other Ambulatory Visit: Payer: Self-pay | Admitting: Podiatry

## 2024-09-21 ENCOUNTER — Telehealth: Payer: Self-pay | Admitting: Medical Genetics

## 2024-09-21 ENCOUNTER — Other Ambulatory Visit: Payer: Self-pay | Admitting: Podiatry

## 2024-09-21 DIAGNOSIS — M7662 Achilles tendinitis, left leg: Secondary | ICD-10-CM | POA: Diagnosis not present

## 2024-09-21 DIAGNOSIS — Z79899 Other long term (current) drug therapy: Secondary | ICD-10-CM

## 2024-09-21 DIAGNOSIS — S93402A Sprain of unspecified ligament of left ankle, initial encounter: Secondary | ICD-10-CM | POA: Diagnosis not present

## 2024-09-21 DIAGNOSIS — S96912A Strain of unspecified muscle and tendon at ankle and foot level, left foot, initial encounter: Secondary | ICD-10-CM | POA: Diagnosis not present

## 2024-09-21 NOTE — Telephone Encounter (Signed)
 [facility] 09/21/2024  9:35 AM  Confirmed I was speaking with Barbara Vasquez [id number] by using name and DOB. Genetic counseling was offered and participant is scheduled. All questions were answered, and participant was thanked for their time and support of the above study. Participant was encouraged to contact [facility] if they have any further questions or concerns.    Jordyn Pennstrom, BS   [facility] Clinical Research Specialist II Direct Dial: [phone]  Fax: [phone]

## 2024-09-24 ENCOUNTER — Ambulatory Visit: Admitting: Licensed Clinical Social Worker

## 2024-09-24 NOTE — Progress Notes (Signed)
 A user error has taken place: encounter opened in error, closed for administrative reasons. Patient does not know how to work the app to [employer].  2nd failed attempt at virtual.  Set an in person apt for December.                 Tawni Louder, LCMHC

## 2024-10-04 ENCOUNTER — Encounter: Payer: Self-pay | Admitting: Genetic Counselor

## 2024-10-04 ENCOUNTER — Ambulatory Visit: Admitting: Genetic Counselor

## 2024-10-04 DIAGNOSIS — E78019 Familial hypercholesterolemia, unspecified: Secondary | ICD-10-CM

## 2024-10-04 DIAGNOSIS — M25572 Pain in left ankle and joints of left foot: Secondary | ICD-10-CM | POA: Diagnosis not present

## 2024-10-04 DIAGNOSIS — M7662 Achilles tendinitis, left leg: Secondary | ICD-10-CM | POA: Diagnosis not present

## 2024-10-04 NOTE — Progress Notes (Signed)
 PRIMARY PROVIDER:  Gretta Comer POUR, NP  PRIMARY REASON FOR VISIT:  1. LDLR-related hypercholesterolemia, autosomal dominant     HISTORY OF PRESENT ILLNESS:   Barbara Vasquez, a 53 y.o. female, was seen by [facility] genetic counseling as follow up to her participation in the [place] Study, part of the [employer] Program. Barbara Vasquez presents to clinic today to discuss the results of the genetic testing provided by the GeneConnect study, which did identify a pathogenic variant in the LDLR gene.   RELEVANT MEDICAL HISTORY:  Barbara Vasquez was first diagnosed with high cholesterol in her 30s, does not recall pretreatment levels. She is currently being treated with Crestor . Most recent lipid level available for review was from 08/2023, with total cholesterol of 210 and LDLC of 129. Measurements date back to 2017, where total cholesterol was 272 and LDLC was 200. She reports she is aware of high cholesterol and is working with her providers. Referral was placed to Dr. Mona in the [facility], appointment not yet scheduled.   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 03/05/2020   Essential hypertension    Flank pain 01/01/2021   Heart murmur    as child   Hypercholesteremia    Left-sided low back pain with left-sided sciatica 07/28/2016   Pain of left hand 02/04/2020   TFC (triangular fibrocartilage complex) injury 05/06/2020    Past Surgical History:  Procedure Laterality Date   ABDOMINAL HYSTERECTOMY     CHOLECYSTECTOMY     COLONOSCOPY WITH PROPOFOL  N/A 12/04/2020   Procedure: COLONOSCOPY WITH PROPOFOL ;  Surgeon: Unk Corinn Skiff, MD;  Location: [facility] ENDOSCOPY;  Service: Gastroenterology;  Laterality: N/A;   HAND TENDON SURGERY Left 04/01/2020   WRIST ARTHROSCOPY Left 12/11/2019   with  TFC debridment, ulanr shortening osteotomy    Social History    Socioeconomic History   Marital status: Single    Spouse name: Not on file   Number of children: Not on file   Years of education: Not on file   Highest education level: Some college, no degree  Occupational History   Not on file  Tobacco Use   Smoking status: Never   Smokeless tobacco: Never  Vaping Use   Vaping status: Never Used  Substance and Sexual Activity   Alcohol use: Never   Drug use: No   Sexual activity: Yes    Birth control/protection: Surgical  Other Topics Concern   Not on file  Social History Narrative   Moved from [place] .   2 children.  5 grandchildren.   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   Social Drivers of Health   Financial Resource Strain: Low Risk  (11/08/2023)   Overall Financial Resource Strain (CARDIA)    Difficulty of Paying Living Expenses: Not hard at all  Food Insecurity: No Food Insecurity (11/08/2023)   Hunger Vital Sign    Worried About Running Out of Food in the Last Year: Never true    Ran Out of Food in the Last Year: Never true  Transportation Needs: No Transportation Needs (11/08/2023)   PRAPARE - Administrator, Civil Service (Medical): No    Lack of Transportation (Non-Medical): No  Physical Activity: Insufficiently Active (11/08/2023)   Exercise Vital Sign    Days of Exercise per Week: 3 days    Minutes of Exercise  per Session: 30 min  Stress: No Stress Concern Present (11/08/2023)   [employer] of Occupational Health - Occupational Stress Questionnaire    Feeling of Stress : Only a little  Social Connections: Moderately Isolated (11/08/2023)   Social Connection and Isolation Panel    Frequency of Communication with Friends and Family: Three times a week    Frequency of Social Gatherings with Friends and Family: Three times a week    Attends Religious Services: 1 to 4 times per year    Active Member of Clubs or Organizations: No    Attends Engineer, Structural: Not on  file    Marital Status: Never married     FAMILY HISTORY:  We obtained a detailed, 3-generation family history.  Significant diagnoses are listed below: Family History  Problem Relation Age of Onset   Stroke Mother 36   Hypertension Father    Heart murmur Father    Hypertension Sister    High Cholesterol Sister    Diabetes Maternal Aunt    Breast cancer Paternal Grandmother 26 - 110   Liver cancer Maternal Aunt 57 - 93    Barbara Vasquez is unaware of previous family history of genetic testing. She reports her children are in their 30s and have had normal cholesterol checks. She reports her sister has been diagnosed with high cholesterol and her mother with a stroke last year, living at age 76. She reports her father has a heart murmur. She is not away of any close relatives with coronary artery disease or other arterial disease. She does not report history of family members with bypass surgery, stents, or MI. She reports a family history of her paternal grandmother with breast cancer at 27 and a maternal aunt with liver cancer in her 63s.     GENETIC TEST RESULTS:  Barbara Vasquez participated in the [place] [facility] population genomic screening program. 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 There were no pathogenic or likely pathogenic variants detected in other genes reported on in this study.   [employer] [facility] Population Screen is a screening test that analyzes 11 genes related to hereditary breast and ovarian cancer syndrome (HBOC), Lynch syndrome, and familial hypercholesterolemia. These include APOB, BRCA1, BRCA2, EPCAM, LDLR, LDLRAP1, PCSK9, PMS2 (excluding exons 11-15), MLH1, MSH2, MSH6. This test reports pathogenic and likely pathogenic variants, but does not report on variants of unknown significance. The absence of any additional pathogenic variants is reassuring, but does not rule out a hereditary condition.  There are other variants and genes associated with heart disease, hereditary cancer and other inherited conditions that were not included in this test. Please see full test report in labs, labeled GeneConnect Molecular Screen, dated 08/24/24.     CLINICAL INFORMATION:  Pathogenic variants in LDLR, such as c.910G>A (p.Asp304Asn), are associated with an inherited condition called familial hypercholesterolemia (FH).   The LDLR gene provides an instruction for our body to make a protein called low-density lipoproteins (LDLs), which play an important role in regulating the amount of cholesterol in the blood. Pathogenic variants or mutations in LDLR that impact gene function can cause a form of high cholesterol called familial hypercholesterolemia. However, not all individuals with LDLR pathogenic variants will have signs of familial hypercholesterolemia.   Familial hypercholesterolemia (FH) is a genetic condition characterized by abnormally high concentrations of low density lipoprotein cholesterol (LDL-C) in the blood since birth. Build up of cholesterol in the body can lead  to plaques, which can lead to arteries that bring blood to the heart to become narrowed or blocked. FH increase the risk for premature coronary heart disease (CHD), stroke, and death.   FH is treatable once identified. Management guidelines for individuals with FH are available through multiple professional organizations and medical societies, including the [employer] Lipid Association Expert Panel on Familial Hypercholesterolemia.  Treatment typically involves pharmacologic therapy, with statins as the first drug of choice, often in combination with other lipid-lowering drugs.  In addition, lifestyle modifications to address cardiovascular risk factors are recommended for all FH patients. Individuals with FH may also benefit from referral to a lipid specialist, particularly if treatment goals are not being met with maximal medical  therapy.    Barbara Vasquez was not interested in discussing the LDLR result in more detail, as she is aware of her high cholesterol and the potential impacts, and is working to manage this with her doctors.   She was interested to discuss the result of the other genes included on the test, specifically the BRCA1 and BRCA2 genes, given the family history of cancer. We reviewed that the test did not identify any variants in these genes that would increase her risk for cancer, nor did it identify any variants in the Lynch syndrome genes (MLH1, MSH2, MSH6, PMS2, EPCAM). We discussed that most cancers occur by chance or environmental causes, with only approximately 10% identified to be hereditary. We reviewed the factors in family history that increase concern for hereditary cancer, such as young ages of diagnosis, rare cancers, strong patterns of cancer.   We reviewed that the GeneConnect study is a screen and that there are other variants and genes that can be associated with cancer that were not included in this report. Further genetic testing could be considered, but would be billable to her insurance or paid out of pocket, given that her family history does not meet NCCN criteria for further testing to be recommended.   IMPLICATIONS FOR FAMILY MEMBERS: Individuals with FH are encouraged to share information about their diagnosis with at-risk family members.  Lipid or molecular analysis can be used to screen family members. Since all first degree relatives of affected individuals have a 50% risk of also having FH, cascade screening is a high-yield and cost-effective way of identifying previously undiagnosed FH patients.   Barbara Vasquez declined family member to help share genetic test results with relatives, stating that her family members are already aware of their risk.   Evaluation of relatives for familial hypercholesterolemia should include minors (individuals under the age of 4). The recommendation  from the National Lipid Association's Expert Panel on FH is that cholesterol screening should be considered beginning at age 77 for children with a family history of premature CHD or elevated cholesterol.  If family members have FH they require immediate initiation of treatment and management of other CHD risk factors (e.g. hypertension, diabetes, smoking) since individuals with FH have a very high lifetime risk of CHD and are at high risk of premature CHD.   ADDITIONAL TESTING: No additional testing was recommended at this time.   RESOURCES: Family Heart Foundation: golfcleaners.co.uk  National Lipid Association: https://www.learnyourlipids.com/  PLAN:  Referral previously placed to [facility], Dr. Mona, in the [facility].   Results will be shared with Barbara Vasquez's primary care provider, Gretta Comer POUR, NP, for further management.   Genetic test results should be shared with relatives, who can consider undergoing  genetic testing for the LDLR  pathogenic variant and/or monitoring of cholesterol.   Lastly, we encouraged Barbara Vasquez to remain in contact with [facility]'s genetics team so that we can continuously update the family history and inform her of any changes in our understanding of familial hypercholesterolemia and any additional genetic testing that may be of benefit for this family.   Our contact information was provided should additional questions or concerns arise.   Barbara Ogren, MS, [facility] Licensed, [email or web address] phone: [phone]  30 minutes were spent on the date of the encounter in service to the patient including preparation, face-to-face consultation, documentation and care coordination. The patient was seen alone.    I connected with  Barbara Vasquez on 10/04/2024 at 2:02 pm EDT by telephone and verified that I am speaking with the  correct person using two identifiers.   Patient location: home Provider location: [facility]  __________________________________________________________________ For Office Staff:  Number of people involved in session: 1 Was an Intern/ student involved with case: no

## 2024-10-16 ENCOUNTER — Ambulatory Visit: Admitting: Licensed Clinical Social Worker

## 2024-10-17 DIAGNOSIS — M7662 Achilles tendinitis, left leg: Secondary | ICD-10-CM | POA: Diagnosis not present

## 2024-11-23 ENCOUNTER — Ambulatory Visit

## 2024-11-23 ENCOUNTER — Encounter: Payer: Self-pay | Admitting: Podiatry

## 2024-11-23 ENCOUNTER — Ambulatory Visit: Admitting: Podiatry

## 2024-11-23 VITALS — Ht 64.5 in | Wt 224.0 lb

## 2024-11-23 DIAGNOSIS — M7732 Calcaneal spur, left foot: Secondary | ICD-10-CM | POA: Diagnosis not present

## 2024-11-23 DIAGNOSIS — D1724 Benign lipomatous neoplasm of skin and subcutaneous tissue of left leg: Secondary | ICD-10-CM

## 2024-11-23 DIAGNOSIS — M7662 Achilles tendinitis, left leg: Secondary | ICD-10-CM

## 2024-11-23 DIAGNOSIS — Z01818 Encounter for other preprocedural examination: Secondary | ICD-10-CM

## 2024-11-23 MED ORDER — TERBINAFINE HCL 250 MG PO TABS: 250.0000 mg | ORAL_TABLET | Freq: Every day | ORAL | 0 refills | Status: AC

## 2024-11-23 NOTE — Patient Instructions (Signed)

## 2024-11-23 NOTE — Progress Notes (Unsigned)
 Refill lamisil . She will f/u w/ pcp for bloodwork  Sx : ankle lipoma LT. Achilles heel spur LT  Rtc postop  Works from home

## 2024-11-29 ENCOUNTER — Telehealth: Payer: Self-pay

## 2024-11-29 ENCOUNTER — Encounter: Admitting: Podiatry

## 2024-11-29 ENCOUNTER — Telehealth: Payer: Self-pay | Admitting: Podiatry

## 2024-11-29 NOTE — Telephone Encounter (Signed)
 Patient called and left a message. She a few more questions for [facility] Please call back. [phone]   thanks

## 2024-11-29 NOTE — Telephone Encounter (Signed)
 Called and patient is scheduled for surgery on 12/20/2024. Patient not on any GLP1 or blood thinners. Pharmacy correct in chart. Order for knee scooter not entered as patient already purchased one on 11/23/2024

## 2024-12-03 ENCOUNTER — Other Ambulatory Visit: Payer: Self-pay | Admitting: Primary Care

## 2024-12-03 DIAGNOSIS — I10 Essential (primary) hypertension: Secondary | ICD-10-CM

## 2024-12-26 ENCOUNTER — Encounter: Admitting: Podiatry

## 2024-12-27 ENCOUNTER — Encounter: Admitting: Podiatry

## 2025-01-04 ENCOUNTER — Institutional Professional Consult (permissible substitution) (HOSPITAL_BASED_OUTPATIENT_CLINIC_OR_DEPARTMENT_OTHER): Admitting: Internal Medicine

## 2025-01-11 ENCOUNTER — Encounter: Admitting: Podiatry

## 2025-01-25 ENCOUNTER — Encounter: Admitting: Podiatry

## 2025-03-07 ENCOUNTER — Encounter: Admitting: Primary Care

## 2025-03-21 ENCOUNTER — Institutional Professional Consult (permissible substitution) (HOSPITAL_BASED_OUTPATIENT_CLINIC_OR_DEPARTMENT_OTHER): Admitting: Internal Medicine
